# Patient Record
Sex: Male | Born: 1960
Health system: Southern US, Community
[De-identification: ages and names within clinical notes are randomized; demographics above are authoritative.]

## PROBLEM LIST (undated history)

## (undated) DIAGNOSIS — K219 Gastro-esophageal reflux disease without esophagitis: Secondary | ICD-10-CM

## (undated) DIAGNOSIS — J309 Allergic rhinitis, unspecified: Secondary | ICD-10-CM

## (undated) DIAGNOSIS — K635 Polyp of colon: Secondary | ICD-10-CM

## (undated) DIAGNOSIS — R9431 Abnormal electrocardiogram [ECG] [EKG]: Secondary | ICD-10-CM

## (undated) DIAGNOSIS — M1810 Unilateral primary osteoarthritis of first carpometacarpal joint, unspecified hand: Secondary | ICD-10-CM

## (undated) DIAGNOSIS — R0609 Other forms of dyspnea: Secondary | ICD-10-CM

## (undated) DIAGNOSIS — R0600 Dyspnea, unspecified: Secondary | ICD-10-CM

## (undated) HISTORY — DX: Polyp of colon: K63.5

## (undated) HISTORY — DX: Other forms of dyspnea: R06.09

## (undated) HISTORY — DX: Gastro-esophageal reflux disease without esophagitis: K21.9

## (undated) HISTORY — DX: Allergic rhinitis, unspecified: J30.9

## (undated) HISTORY — DX: Unilateral primary osteoarthritis of first carpometacarpal joint, unspecified hand: M18.10

## (undated) HISTORY — PX: COLONOSCOPY: SHX174

## (undated) HISTORY — DX: Dyspnea, unspecified: R06.00

## (undated) HISTORY — DX: Abnormal electrocardiogram (ECG) (EKG): R94.31

## (undated) HISTORY — PX: CHOLECYSTECTOMY: SHX55

---

## 2001-03-03 ENCOUNTER — Ambulatory Visit (HOSPITAL_COMMUNITY): Admission: RE | Admit: 2001-03-03 | Discharge: 2001-03-03 | Payer: Self-pay | Admitting: Gastroenterology

## 2001-04-11 ENCOUNTER — Ambulatory Visit (HOSPITAL_COMMUNITY): Admission: RE | Admit: 2001-04-11 | Discharge: 2001-04-11 | Payer: Self-pay | Admitting: Urology

## 2001-04-11 ENCOUNTER — Encounter: Payer: Self-pay | Admitting: Urology

## 2002-06-27 ENCOUNTER — Ambulatory Visit (HOSPITAL_COMMUNITY): Admission: RE | Admit: 2002-06-27 | Discharge: 2002-06-27 | Payer: Self-pay | Admitting: Family Medicine

## 2002-06-27 ENCOUNTER — Encounter: Payer: Self-pay | Admitting: Family Medicine

## 2002-10-22 ENCOUNTER — Encounter: Admission: RE | Admit: 2002-10-22 | Discharge: 2002-11-08 | Payer: Self-pay | Admitting: Unknown Physician Specialty

## 2003-01-28 ENCOUNTER — Encounter: Admission: RE | Admit: 2003-01-28 | Discharge: 2003-03-25 | Payer: Self-pay | Admitting: Specialist

## 2003-09-30 IMAGING — CT CT ABDOMEN WO/W CM
1 of 3 series · 13 of 32 positions shown, 19 images · IV contrast (CONTRAST)
Comparison: none

FINDINGS
CLINICAL DATA: LOW BACK PAIN AND PROSTATITIS.
CT ABDOMEN WITHOUT AND WITH CONTRAST
FOLLOWING ORAL CONTRAST ADMINISTRATION, INITIAL AXIAL IMAGES WERE OBTAINED THROUGH THE KIDNEYS
WITHOUT INTRAVENOUS CONTRAST.  THESE WERE FOLLOWED BY AXIAL IMAGES THROUGH THE ABDOMEN AND PELVIS
WITH 100 CC OMNIPAQUE 300 INTRAVENOUS CONTRAST.  THE KIDNEYS HAVE NORMAL APPEARANCES.
SPECIFICALLY, NO RENAL CALCULI ARE SEEN AND NO HYDRONEPHROSIS IS DEMONSTRATED.  MILDLY PROMINENT
EXTRARENAL PELVES ARE NOTED BILATERALLY, WITHOUT CALYCEAL DILATATION.  CHOLECYSTECTOMY CLIPS ARE
NOTED.  THE LIVER, SPLEEN, PANCREAS AND ADRENAL GLANDS HAVE NORMAL APPEARANCES.  NO ENLARGED LYMPH
NODES ARE SEEN.  MULTIPLE COLON DIVERTICULA ARE DEMONSTRATED WITHOUT EVIDENCE OF DIVERTICULITIS.
THE REGIONAL BONES ARE UNREMARKABLE.
IMPRESSION
1.  COLON  DIVERTICULOSIS WITHOUT EVIDENCE OF DIVERTICULITIS.
2.  STATUS POST CHOLECYSTECTOMY.
CT PELVIS WITH CONTRAST
THE PROSTATE GLAND URINARY BLADDER HAVE NORMAL APPEARANCES.  MULTIPLE SIGMOID COLON DIVERTICULA ARE
DEMONSTRATED WITHOUT EVIDENCE OF DIVERTICULITIS.  NO ENLARGED LYMPH NODES ARE SEEN.  THE PELVIC
BONES ARE UNREMARKABLE.
1.  SIGMOID DIVERTICULOSIS.
2.  NO ACUTE ABNORMALITY.

[Series 1841: — · axial · 0.69mm/px · z∈[+1372,+1762]mm · 13 of 90 slices shown, 19 images]
[im 6/90  soft-tissue]
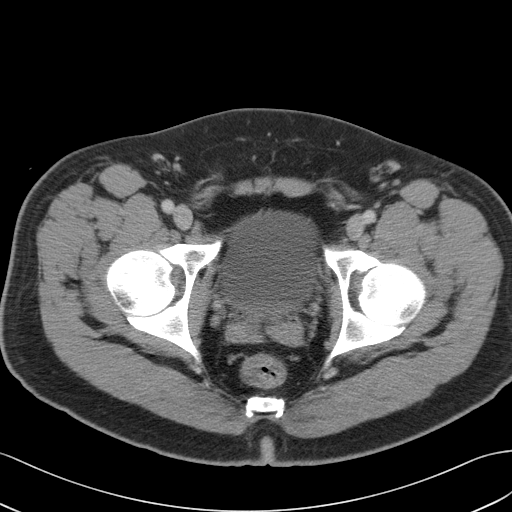
[im 6/90  bone]
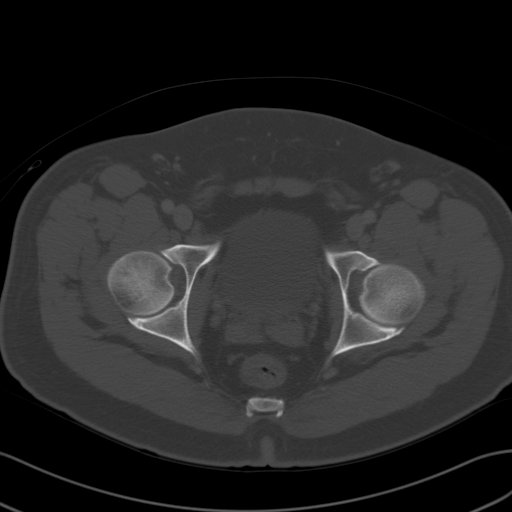
[im 12/90  soft-tissue]
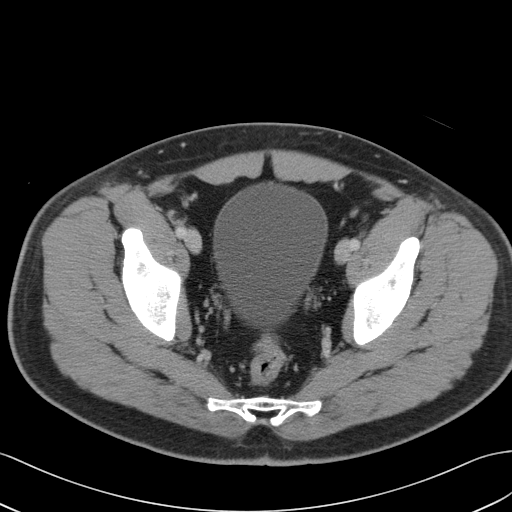
[im 18/90  soft-tissue]
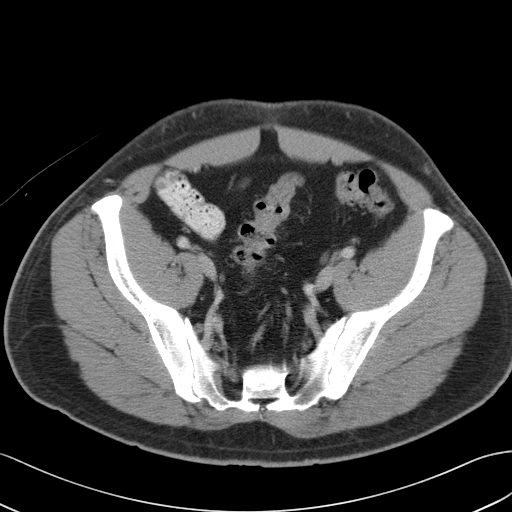
[im 24/90  soft-tissue]
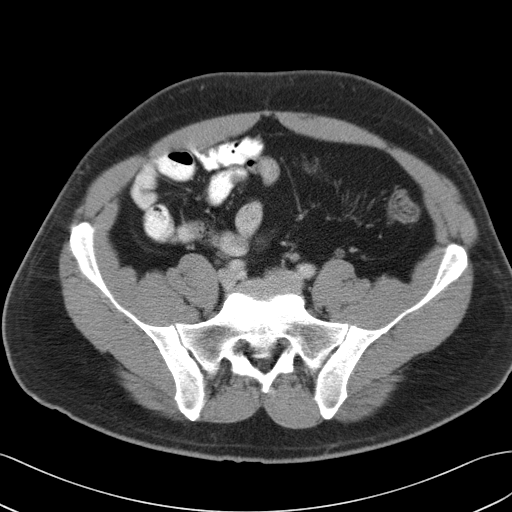
[im 30/90  soft-tissue]
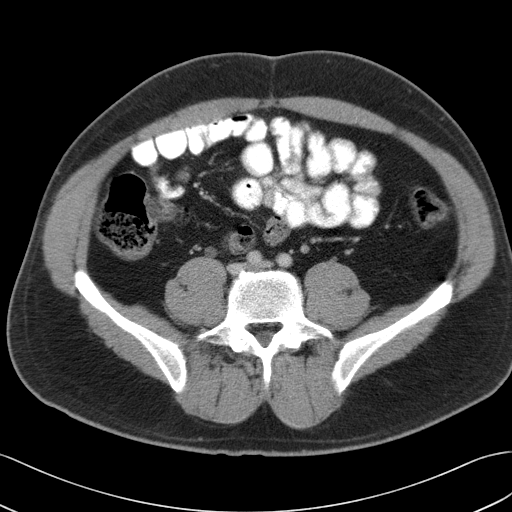
[im 36/90  soft-tissue]
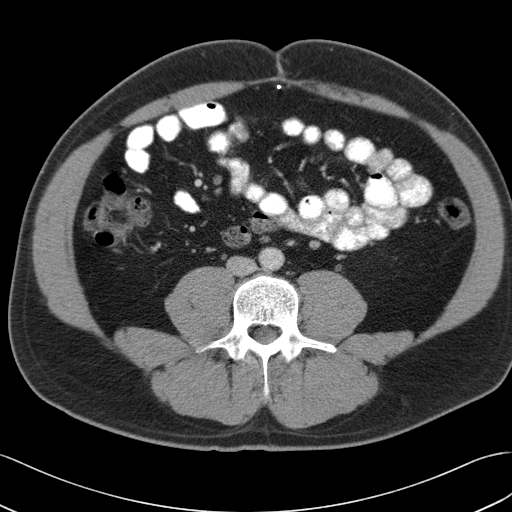
[im 48/90  soft-tissue]
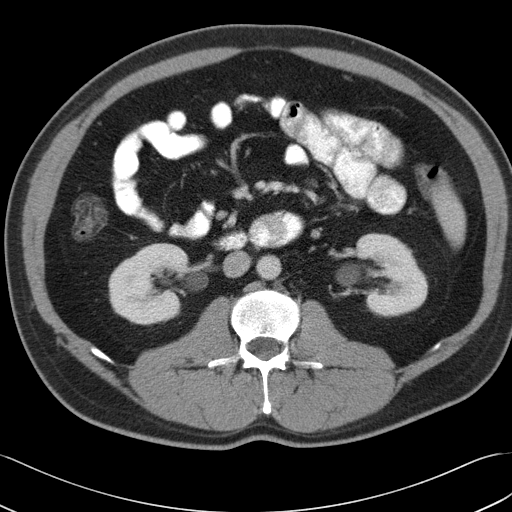
[im 54/90  soft-tissue]
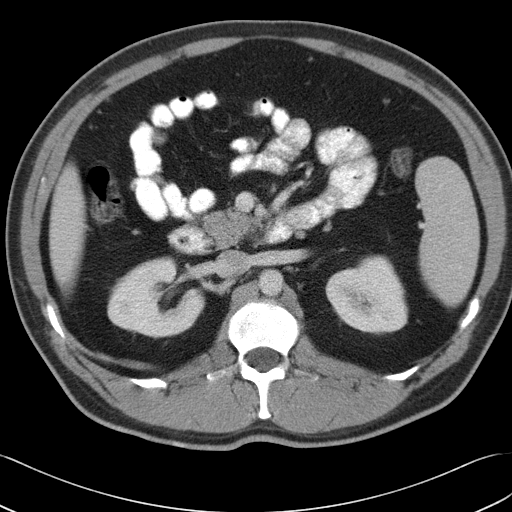
[im 60/90  soft-tissue]
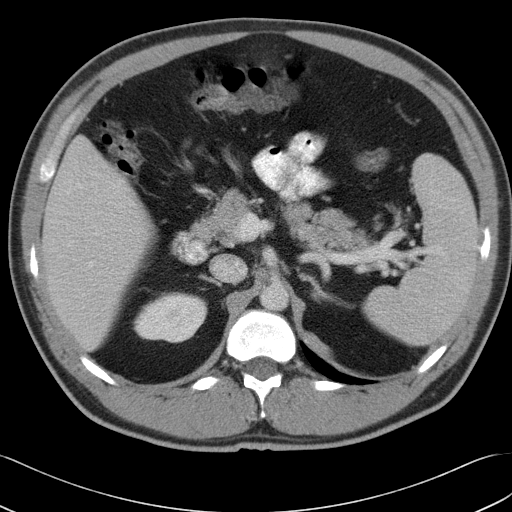
[im 60/90  bone]
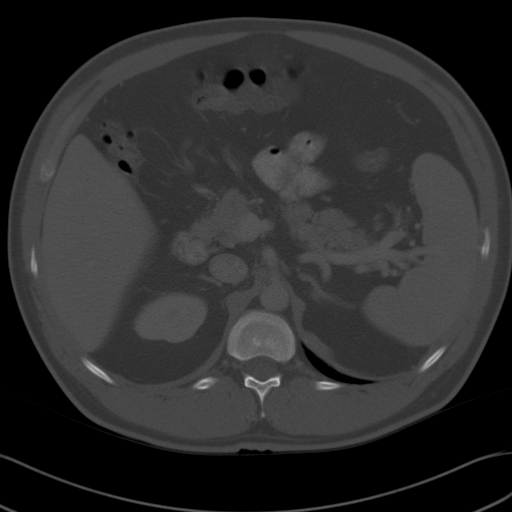
[im 66/90  soft-tissue]
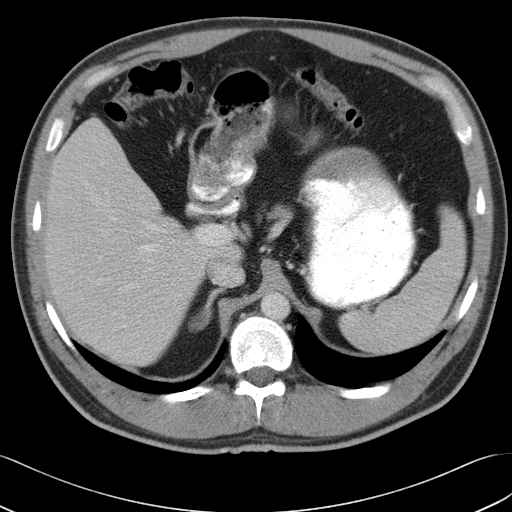
[im 66/90  lung]
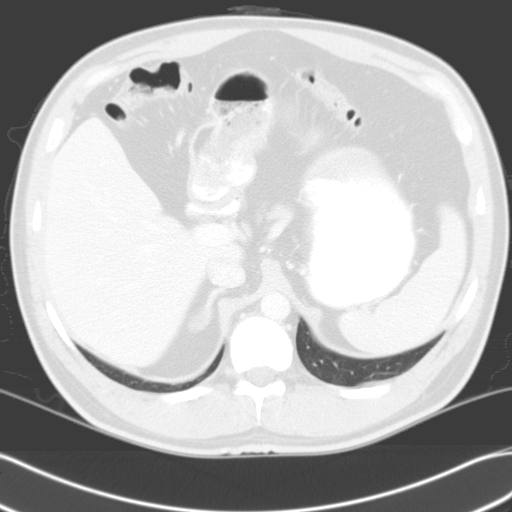
[im 72/90  soft-tissue]
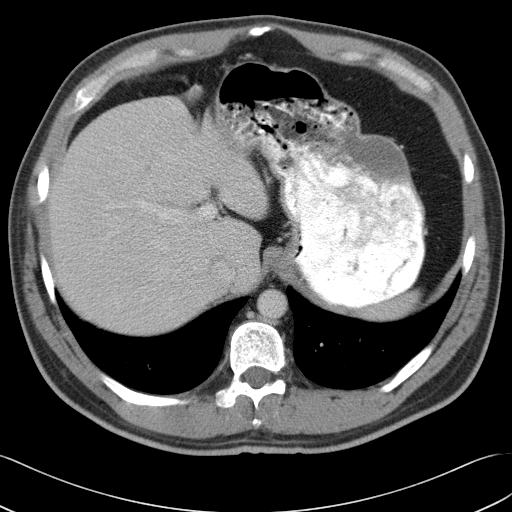
[im 72/90  lung]
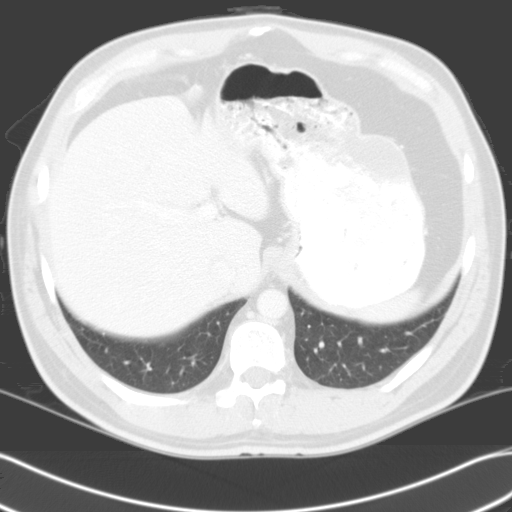
[im 78/90  soft-tissue]
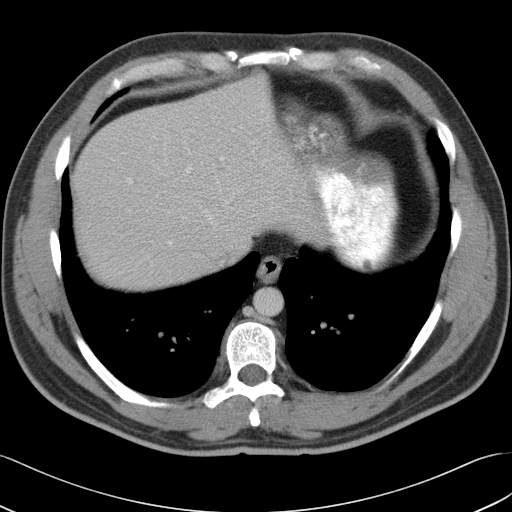
[im 78/90  lung]
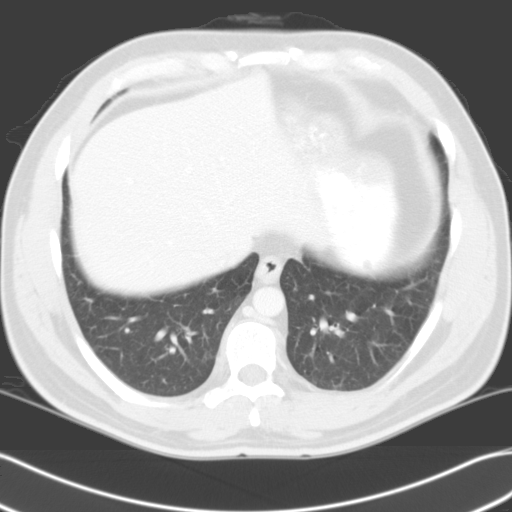
[im 84/90  soft-tissue]
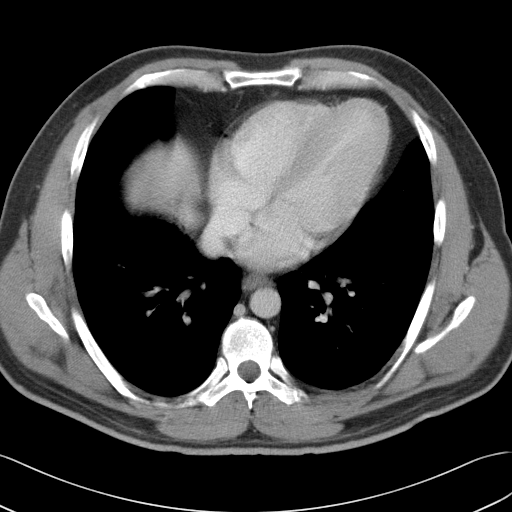
[im 84/90  lung]
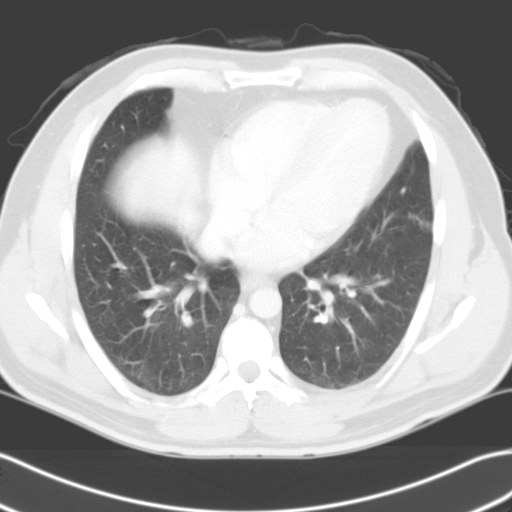

[13 of 32 positions shown; findings below may reference images not displayed]

## 2011-04-05 ENCOUNTER — Encounter (HOSPITAL_COMMUNITY): Payer: Self-pay | Admitting: *Deleted

## 2011-04-05 ENCOUNTER — Emergency Department (HOSPITAL_COMMUNITY)
Admission: EM | Admit: 2011-04-05 | Discharge: 2011-04-05 | Disposition: A | Discharge status: Home / Self Care | Payer: Managed Care, Other (non HMO) | Attending: Emergency Medicine | Admitting: Emergency Medicine

## 2011-04-05 DIAGNOSIS — M545 Low back pain, unspecified: Secondary | ICD-10-CM | POA: Insufficient documentation

## 2011-04-05 DIAGNOSIS — S39012A Strain of muscle, fascia and tendon of lower back, initial encounter: Secondary | ICD-10-CM

## 2011-04-05 LAB — POCT I-STAT, CHEM 8
BUN: 9 mg/dL (ref 6–23)
Calcium, Ion: 1.19 mmol/L (ref 1.12–1.32)
Chloride: 106 mEq/L (ref 96–112)
Creatinine, Ser: 1 mg/dL (ref 0.50–1.35)
Glucose, Bld: 113 mg/dL — ABNORMAL HIGH (ref 70–99)
HCT: 48 % (ref 39.0–52.0)
Hemoglobin: 16.3 g/dL (ref 13.0–17.0)
Potassium: 4.3 mEq/L (ref 3.5–5.1)
Sodium: 141 mEq/L (ref 135–145)
TCO2: 24 mmol/L (ref 0–100)

## 2011-04-05 LAB — URINALYSIS, ROUTINE W REFLEX MICROSCOPIC
Bilirubin Urine: NEGATIVE
Glucose, UA: NEGATIVE mg/dL
Hgb urine dipstick: NEGATIVE
Ketones, ur: NEGATIVE mg/dL
Leukocytes, UA: NEGATIVE
Nitrite: NEGATIVE
Protein, ur: NEGATIVE mg/dL
Specific Gravity, Urine: 1.02 (ref 1.005–1.030)
Urobilinogen, UA: 0.2 mg/dL (ref 0.0–1.0)
pH: 5.5 (ref 5.0–8.0)

## 2011-04-05 MED ORDER — CYCLOBENZAPRINE HCL 10 MG PO TABS: 10.0000 mg | ORAL_TABLET | Freq: Two times a day (BID) | ORAL | Status: AC | PRN

## 2011-04-05 MED ORDER — OXYCODONE-ACETAMINOPHEN 5-325 MG PO TABS: 2.0000 | ORAL_TABLET | ORAL | Status: AC | PRN

## 2011-04-05 MED ORDER — KETOROLAC TROMETHAMINE 60 MG/2ML IM SOLN
60.0000 mg | Freq: Once | INTRAMUSCULAR | Status: AC
  Administered 2011-04-05: 60 mg via INTRAMUSCULAR
  Filled 2011-04-05: qty 2

## 2011-04-05 NOTE — ED Notes (Signed)
Pt presents to er with right flank pain associated with nausea, pt states that the pain started yesterday morning, comes and goes, denies any injury, pt also states that if he turns to the right the pain is increased and pain is increased with bending over.

## 2011-04-05 NOTE — Discharge Instructions (Signed)
Back Exercises   Back exercises help treat and prevent back injuries. The goal of back exercises is to increase the strength of your abdominal and back muscles and the flexibility of your back. These exercises should be started when you no longer have back pain. Back exercises include:   Pelvic Tilt. Lie on your back with your knees bent. Tilt your pelvis until the lower part of your back is against the floor. Hold this position 5 to 10 sec and repeat 5 to 10 times.   Knee to Chest. Pull first 1 knee up against your chest and hold for 20 to 30 seconds, repeat this with the other knee, and then both knees. This may be done with the other leg straight or bent, whichever feels better.   Sit-Ups or Curl-Ups. Bend your knees 90 degrees. Start with tilting your pelvis, and do a partial, slow sit-up, lifting your trunk only 30 to 45 degrees off the floor. Take at least 2 to 3 seconds for each sit-up. Do not do sit-ups with your knees out straight. If partial sit-ups are difficult, simply do the above but with only tightening your abdominal muscles and holding it as directed.   Hip-Lift. Lie on your back with your knees flexed 90 degrees. Push down with your feet and shoulders as you raise your hips a couple inches off the floor; hold for 10 seconds, repeat 5 to 10 times.   Back arches. Lie on your stomach, propping yourself up on bent elbows. Slowly press on your hands, causing an arch in your low back. Repeat 3 to 5 times. Any initial stiffness and discomfort should lessen with repetition over time.   Shoulder-Lifts. Lie face down with arms beside your body. Keep hips and torso pressed to floor as you slowly lift your head and shoulders off the floor.   Do not overdo your exercises, especially in the beginning. Exercises may cause you some mild back discomfort which lasts for a few minutes; however, if the pain is more severe, or lasts for more than 15 minutes, do not continue exercises until you see your caregiver.  Improvement with exercise therapy for back problems is slow.   See your caregivers for assistance with developing a proper back exercise program.   Document Released: 01/29/2004 Document Revised: 12/10/2010 Document Reviewed: 12/21/2004   ExitCare® Patient Information ©2012 [employer].     Back Pain, Adult   Low back pain is very common. About 1 in 5 people have back pain. The cause of low back pain is rarely dangerous. The pain often gets better over time. About half of people with a sudden onset of back pain feel better in just 2 weeks. About 8 in 10 people feel better by 6 weeks.   CAUSES   Some common causes of back pain include:   Strain of the muscles or ligaments supporting the spine.   Wear and tear (degeneration) of the spinal discs.   Arthritis.   Direct injury to the back.   DIAGNOSIS   Most of the time, the direct cause of low back pain is not known. However, back pain can be treated effectively even when the exact cause of the pain is unknown. Answering your caregiver's questions about your overall health and symptoms is one of the most accurate ways to make sure the cause of your pain is not dangerous. If your caregiver needs more information, he or she may order lab work or imaging tests (X-rays or MRIs). However, even   if imaging tests show changes in your back, this usually does not require surgery.   HOME CARE INSTRUCTIONS   For many people, back pain returns. Since low back pain is rarely dangerous, it is often a condition that people can learn to manage on their own.   Remain active. It is stressful on the back to sit or stand in one place. Do not sit, drive, or stand in one place for more than 30 minutes at a time. Take short walks on level surfaces as soon as pain allows. Try to increase the length of time you walk each day.   Do not stay in bed. Resting more than 1 or 2 days can delay your recovery.   Do not avoid exercise or work. Your body is made to move. It is not dangerous to be active,  even though your back may hurt. Your back will likely heal faster if you return to being active before your pain is gone.   Pay attention to your body when you bend and lift. Many people have less discomfort when lifting if they bend their knees, keep the load close to their bodies, and avoid twisting. Often, the most comfortable positions are those that put less stress on your recovering back.   Find a comfortable position to sleep. Use a firm mattress and lie on your side with your knees slightly bent. If you lie on your back, put a pillow under your knees.   Only take over-the-counter or prescription medicines as directed by your caregiver. Over-the-counter medicines to reduce pain and inflammation are often the most helpful. Your caregiver may prescribe muscle relaxant drugs. These medicines help dull your pain so you can more quickly return to your normal activities and healthy exercise.   Put ice on the injured area.   Put ice in a plastic bag.   Place a towel between your skin and the bag.   Leave the ice on for 15 to 20 minutes, 3 to 4 times a day for the first 2 to 3 days. After that, ice and heat may be alternated to reduce pain and spasms.   Ask your caregiver about trying back exercises and gentle massage. This may be of some benefit.   Avoid feeling anxious or stressed. Stress increases muscle tension and can worsen back pain. It is important to recognize when you are anxious or stressed and learn ways to manage it. Exercise is a great option.   SEEK MEDICAL CARE IF:   You have pain that is not relieved with rest or medicine.   You have pain that does not improve in 1 week.   You have new symptoms.   You are generally not feeling well.   SEEK IMMEDIATE MEDICAL CARE IF:   You have pain that radiates from your back into your legs.   You develop new bowel or bladder control problems.   You have unusual weakness or numbness in your arms or legs.   You develop nausea or vomiting.   You develop abdominal  pain.   You feel faint.   Document Released: 12/21/2004 Document Revised: 12/10/2010 Document Reviewed: 05/11/2010   ExitCare® Patient Information ©2012 [employer].

## 2011-04-05 NOTE — ED Provider Notes (Signed)
History   This chart was scribed for Fernando Quarry, MD by Fernando Nguyen. The patient was seen in room APA10/APA10. Patient's care was started at 0653.   CSN: [id number]  Arrival date & time 04/05/11  5621   First MD Initiated Contact with Patient 04/05/11 0703      Chief Complaint  Patient presents with  . Flank Pain    (Consider location/radiation/quality/duration/timing/severity/associated sxs/prior treatment) HPI Fernando Nguyen is a 51 y.o. male who presents to the Emergency Department complaining of 6/10 right flank pain onset yesterday morning and intermittent since with associated nausea. Patient describes pain as a dull ache which sometimes radiates to the abdomen and groin but denies radiation to testicles. Pain is aggravated into a sharp pain with 10/10 severity, by turing to the right and bending over. Denies injury to right flank, and dysuria. Patient h/o cholecystectomy. Family history of kidney stones.   History reviewed. No pertinent past medical history.  Past Surgical History  Procedure Date  . Cholecystectomy     No family history on file.  History  Substance Use Topics  . Smoking status: Never Smoker   . Smokeless tobacco: Not on file  . Alcohol Use: No      Review of Systems A complete 10 system review of systems was obtained and all systems are negative except as noted in the HPI and PMH.   Allergies  Penicillins  Home Medications  No current outpatient prescriptions on file.  BP 158/99  Pulse 85  Temp 98.6 F (37 C)  Resp 18  Ht 5' 5.5" (1.664 m)  Wt 202 lb (91.627 kg)  BMI 33.10 kg/m2  SpO2 96%  Physical Exam  Nursing note and vitals reviewed. Constitutional: He is oriented to person, place, and time. He appears well-developed and well-nourished. No distress.  HENT:  Head: Normocephalic and atraumatic.  Mouth/Throat: Oropharynx is clear and moist.  Eyes: EOM are normal. Pupils are equal, round, and reactive to light.  Neck: Neck  supple. No tracheal deviation present.  Cardiovascular: Normal rate, regular rhythm and normal heart sounds.   Pulmonary/Chest: Effort normal and breath sounds normal. No respiratory distress. He has no wheezes.  Abdominal: Soft. He exhibits no distension. There is no tenderness.  Musculoskeletal: Normal range of motion. He exhibits tenderness (Right flank). He exhibits no edema.  Neurological: He is alert and oriented to person, place, and time. No sensory deficit.  Skin: Skin is warm and dry.  Psychiatric: He has a normal mood and affect. His behavior is normal.    ED Course  Procedures (including critical care time) DIAGNOSTIC STUDIES: Oxygen Saturation is 96% on room air, normal by my interpretation.    COORDINATION OF CARE: 7:20 AM- Patient informed of current plan for treatment and evaluation and agrees with plan at this time.      Labs Reviewed  URINALYSIS, ROUTINE W REFLEX MICROSCOPIC   No results found.   No diagnosis found.    MDM  Patient with tender right lower muscular pain in the back. He does not have any blood in his urine. His creatinine is normal. I am treating this is musculoskeletal pain. Patient is instructed that will take anti-inflammatories and pain medicine. She will followup with primary care care Dr.    I personally performed the services described in this documentation, which was scribed in my presence. The recorded information has been reviewed and considered.    Fernando Quarry, MD 04/05/11 [phone]

## 2012-04-17 ENCOUNTER — Encounter: Payer: Self-pay | Admitting: Gastroenterology

## 2012-05-12 ENCOUNTER — Encounter: Payer: Self-pay | Admitting: Gastroenterology

## 2012-05-12 ENCOUNTER — Telehealth: Payer: Self-pay | Admitting: Genetic Counselor

## 2012-05-12 ENCOUNTER — Ambulatory Visit (INDEPENDENT_AMBULATORY_CARE_PROVIDER_SITE_OTHER): Payer: BC Managed Care – PPO | Admitting: Gastroenterology

## 2012-05-12 ENCOUNTER — Telehealth: Payer: Self-pay | Admitting: Gastroenterology

## 2012-05-12 VITALS — BP 140/72 | HR 88 | Ht 65.0 in | Wt 202.5 lb

## 2012-05-12 DIAGNOSIS — R195 Other fecal abnormalities: Secondary | ICD-10-CM

## 2012-05-12 DIAGNOSIS — Z800 Family history of malignant neoplasm of digestive organs: Secondary | ICD-10-CM

## 2012-05-12 MED ORDER — PEG-KCL-NACL-NASULF-NA ASC-C 100 G PO SOLR: 1.0000 | Freq: Once | ORAL | Status: DC

## 2012-05-12 NOTE — Patient Instructions (Addendum)
You will be set up for a colonoscopy (LEC, moderate sedation) for strong FH of colon cancer, FOBT+. We will refer to genetic councilor to discuss your FH of colon cancer (brother and father). We will contact you with an appt date and time for your genetic counselor.   You have been scheduled for a colonoscopy. Please follow written instructions given to you at your visit today.  Please pick up your prep kit at the pharmacy within the next 1-3 days. If you use inhalers (even only as needed), please bring them with you on the day of your procedure. Your physician has requested that you go to www.startemmi.com and enter the access code given to you at your visit today. This web site gives a general overview about your procedure. However, you should still follow specific instructions given to you by our office regarding your preparation for the procedure.

## 2012-05-12 NOTE — Telephone Encounter (Signed)
S/W PT IN RE GENETIC APPT 6/09 @ 11 W/KAREN POWELL.  REFERRING DR. DAN JACOBS WELCOME PACKET MAILED.

## 2012-05-12 NOTE — Telephone Encounter (Signed)
Will forward to Dr. Christella Hartigan to let him know patient cancelled appt with genetic counselor.

## 2012-05-12 NOTE — Telephone Encounter (Signed)
Ok, thanks.

## 2012-05-12 NOTE — Progress Notes (Signed)
HPI: This is a    very pleasant 52 year old man whom I am seeing today for the first time.  He is with his wife today.  Dr. Wylene Simmer is his PCP, he had Hemoccult testing as part of her routine physical and it was positive.   His father had CRC, diagnosed in his 85's, died in his early 10s.    His older brother also died of colon cancer at age 22.  He had colonoscopy 2001, he believes he had 2-3 polyps,  Maybe done by [facility]  Review of systems: Pertinent positive and negative review of systems were noted in the above HPI section. Complete review of systems was performed and was otherwise normal.    Past Medical History  Diagnosis Date  . Colon polyps     Past Surgical History  Procedure Laterality Date  . Cholecystectomy      No current outpatient prescriptions on file.   No current facility-administered medications for this visit.    Allergies as of 05/12/2012 - Review Complete 05/12/2012  Allergen Reaction Noted  . Penicillins  04/05/2011    Family History  Problem Relation Age of Onset  . Colon cancer Father     deceased  . Colon cancer Brother 12    deceased age 68  . Colon polyps Father   . Ulcers Father     gastric  . Diabetes Mother   . Kidney disease Mother   . Heart disease Mother   . Diabetes Paternal Grandmother   . Heart disease Paternal Grandmother   . Ovarian cancer Maternal Grandmother     History   Social History  . Marital Status: Married    Spouse Name: N/A    Number of Children: 2  . Years of Education: N/A   Occupational History  . Licensed conveyancer    Social History Main Topics  . Smoking status: Never Smoker   . Smokeless tobacco: Never Used  . Alcohol Use: No  . Drug Use: No  . Sexually Active: Not on file   Other Topics Concern  . Not on file   Social History Narrative  . No narrative on file       Physical Exam: Ht 5\' 5"  (1.651 m)  Wt 202 lb 8 oz (91.853 kg)  BMI 33.7 kg/m2 Constitutional: generally  well-appearing Psychiatric: alert and oriented x3 Eyes: extraocular movements intact Mouth: oral pharynx moist, no lesions Neck: supple no lymphadenopathy Cardiovascular: heart regular rate and rhythm Lungs: clear to auscultation bilaterally Abdomen: soft, nontender, nondistended, no obvious ascites, no peritoneal signs, normal bowel sounds Extremities: no lower extremity edema bilaterally Skin: no lesions on visible extremities    Assessment and plan: 52 y.o. male with  2 first-degree relatives with colon cancer, Hemoccult-positive stool  By recommended we go ahead with colonoscopy at his soonest convenience. I explained to him that he did not need any further Hemoccult testing for colon cancer screening. Instead he should be getting colonoscopy at least every 5 years given his significant family history. I am going to refer him to genetic counseling as well, he has 2 first-degree relatives with colon cancer and should be considered for evaluation for Lynch syndrome. That would increase the frequency of his colon cancer screening significantly.

## 2012-05-17 ENCOUNTER — Ambulatory Visit (AMBULATORY_SURGERY_CENTER): Payer: BC Managed Care – PPO | Admitting: Gastroenterology

## 2012-05-17 ENCOUNTER — Encounter: Payer: Self-pay | Admitting: Gastroenterology

## 2012-05-17 VITALS — BP 118/73 | HR 81 | Temp 97.6°F | Resp 19 | Ht 65.0 in | Wt 202.0 lb

## 2012-05-17 DIAGNOSIS — R195 Other fecal abnormalities: Secondary | ICD-10-CM

## 2012-05-17 DIAGNOSIS — D126 Benign neoplasm of colon, unspecified: Secondary | ICD-10-CM

## 2012-05-17 DIAGNOSIS — Z800 Family history of malignant neoplasm of digestive organs: Secondary | ICD-10-CM

## 2012-05-17 DIAGNOSIS — K5730 Diverticulosis of large intestine without perforation or abscess without bleeding: Secondary | ICD-10-CM

## 2012-05-17 MED ORDER — SODIUM CHLORIDE 0.9 % IV SOLN: 500.0000 mL | INTRAVENOUS | Status: DC

## 2012-05-17 NOTE — Patient Instructions (Addendum)
Impressions/recommendations:  Polyp (handout given) Diverticulosis (handout given) High Fiber Diet (handout given)  YOU HAD AN ENDOSCOPIC PROCEDURE TODAY AT THE [facility]: Refer to the procedure report that was given to you for any specific questions about what was found during the examination.  If the procedure report does not answer your questions, please call your gastroenterologist to clarify.  If you requested that your care partner not be given the details of your procedure findings, then the procedure report has been included in a sealed envelope for you to review at your convenience later.  YOU SHOULD EXPECT: Some feelings of bloating in the abdomen. Passage of more gas than usual.  Walking can help get rid of the air that was put into your GI tract during the procedure and reduce the bloating. If you had a lower endoscopy (such as a colonoscopy or flexible sigmoidoscopy) you may notice spotting of blood in your stool or on the toilet paper. If you underwent a bowel prep for your procedure, then you may not have a normal bowel movement for a few days.  DIET: Your first meal following the procedure should be a light meal and then it is ok to progress to your normal diet.  A half-sandwich or bowl of soup is an example of a good first meal.  Heavy or fried foods are harder to digest and may make you feel nauseous or bloated.  Likewise meals heavy in dairy and vegetables can cause extra gas to form and this can also increase the bloating.  Drink plenty of fluids but you should avoid alcoholic beverages for 24 hours.  ACTIVITY: Your care partner should take you home directly after the procedure.  You should plan to take it easy, moving slowly for the rest of the day.  You can resume normal activity the day after the procedure however you should NOT DRIVE or use heavy machinery for 24 hours (because of the sedation medicines used during the test).    SYMPTOMS TO REPORT IMMEDIATELY: A  gastroenterologist can be reached at any hour.  During normal business hours, 8:30 AM to 5:00 PM Monday through Friday, call [phone].  After hours and on weekends, please call the GI answering service at [phone] who will take a message and have the physician on call contact you.   Following lower endoscopy (colonoscopy or flexible sigmoidoscopy):  Excessive amounts of blood in the stool  Significant tenderness or worsening of abdominal pains  Swelling of the abdomen that is new, acute  Fever of 100F or higher  FOLLOW UP: If any biopsies were taken you will be contacted by phone or by letter within the next 1-3 weeks.  Call your gastroenterologist if you have not heard about the biopsies in 3 weeks.  Our staff will call the home number listed on your records the next business day following your procedure to check on you and address any questions or concerns that you may have at that time regarding the information given to you following your procedure. This is a courtesy call and so if there is no answer at the home number and we have not heard from you through the emergency physician on call, we will assume that you have returned to your regular daily activities without incident.  SIGNATURES/CONFIDENTIALITY: You and/or your care partner have signed paperwork which will be entered into your electronic medical record.  These signatures attest to the fact that that the information above on your After Visit Summary   has been reviewed and is understood.  Full responsibility of the confidentiality of this discharge information lies with you and/or your care-partner. 

## 2012-05-17 NOTE — Progress Notes (Signed)
Patient did not have preoperative order for IV antibiotic SSI prophylaxis. (G8918)  Patient did not experience any of the following events: a burn prior to discharge; a fall within the facility; wrong site/side/patient/procedure/implant event; or a hospital transfer or hospital admission upon discharge from the facility. (G8907)  

## 2012-05-17 NOTE — Op Note (Signed)
[facility] [address] [place], [address]   COLONOSCOPY PROCEDURE REPORT  PATIENT: Fernando Nguyen, Fernando Nguyen.  MR#: [id number] BIRTHDATE: 06-04-1960 , 52  yrs. old GENDER: Male ENDOSCOPIST: Rachael Fee, MD REFERRED GE:[id number] Tisovec, M.D. PROCEDURE DATE:  05/17/2012 PROCEDURE:   Colonoscopy with snare polypectomy ASA CLASS:   Class II INDICATIONS: fobt + stool.  His father had CRC, diagnosed in his 70's, died in his early 83s. His older brother also died of colon cancer at age 44. He had colonoscopy 2001, he believes he had 2-3 polyps, Maybe done by [facility]  MEDICATIONS: Fentanyl 50 mcg IV, Versed 7 mg IV, and These medications were titrated to patient response per physician's verbal order DESCRIPTION OF PROCEDURE:   After the risks benefits and alternatives of the procedure were thoroughly explained, informed consent was obtained.  A digital rectal exam revealed no abnormalities of the rectum.   The LB [id number]  endoscope was introduced through the anus and advanced to the cecum, which was identified by both the appendix and ileocecal valve. No adverse events experienced.   The quality of the prep was good.  The instrument was then slowly withdrawn as the colon was fully examined.   COLON FINDINGS: There were several diverticulum in left colon.  One small polyp was found, removed and sent to pathology.  This was in transverse segment, 3mm, sessile, required snare/cautery removal. The examination was otherwise normal.  Retroflexed views revealed no abnormalities. The time to cecum=2 minutes 03 seconds. Withdrawal time=7 minutes 58 seconds.  The scope was withdrawn and the procedure completed. COMPLICATIONS: There were no complications.  ENDOSCOPIC IMPRESSION: There were several diverticulum in left colon. One small polyp was found, removed and sent to pathology. The examination was otherwise normal.  RECOMMENDATIONS: 1.  Given your significant  family history of colon cancer, you should have a repeat colonoscopy in 2 years given your family history and the possibility you have Lynch Syndrome.  Please reconsider referral to genetic councilor to help with this issue as well. 2.  You will receive a letter within 1-2 weeks with the results of your biopsy as well as final recommendations.  Please call my office if you have not received a letter after 3 weeks.  eSigned:  Rachael Fee, MD 05/17/2012 8:57 AM

## 2012-05-18 ENCOUNTER — Telehealth: Payer: Self-pay | Admitting: *Deleted

## 2012-05-18 NOTE — Telephone Encounter (Signed)
  Follow up Call-  Call back number 05/17/2012  Post procedure Call Back phone  # [phone]  Permission to leave phone message Yes   [facility]

## 2012-05-25 ENCOUNTER — Encounter: Payer: Self-pay | Admitting: Gastroenterology

## 2012-06-12 ENCOUNTER — Other Ambulatory Visit: Payer: BC Managed Care – PPO | Admitting: Lab

## 2012-06-12 ENCOUNTER — Encounter: Payer: BC Managed Care – PPO | Admitting: Genetic Counselor

## 2014-05-24 ENCOUNTER — Encounter: Payer: Self-pay | Admitting: Gastroenterology

## 2015-05-21 ENCOUNTER — Encounter: Payer: Self-pay | Admitting: Gastroenterology

## 2015-05-28 ENCOUNTER — Telehealth: Payer: Self-pay | Admitting: Gastroenterology

## 2015-05-29 NOTE — Telephone Encounter (Signed)
Left message on machine to call back  

## 2015-05-30 NOTE — Telephone Encounter (Signed)
Left message on machine to call back  

## 2015-05-30 NOTE — Telephone Encounter (Signed)
Unable to reach pt will await further communication from the pt  

## 2015-06-25 ENCOUNTER — Encounter: Payer: Self-pay | Admitting: Gastroenterology

## 2015-08-01 ENCOUNTER — Ambulatory Visit (INDEPENDENT_AMBULATORY_CARE_PROVIDER_SITE_OTHER): Payer: BLUE CROSS/BLUE SHIELD | Admitting: Gastroenterology

## 2015-08-01 ENCOUNTER — Encounter: Payer: Self-pay | Admitting: Gastroenterology

## 2015-08-01 VITALS — BP 150/76 | HR 88 | Ht 65.0 in | Wt 206.2 lb

## 2015-08-01 DIAGNOSIS — Z800 Family history of malignant neoplasm of digestive organs: Secondary | ICD-10-CM | POA: Diagnosis not present

## 2015-08-01 MED ORDER — NA SULFATE-K SULFATE-MG SULF 17.5-3.13-1.6 GM/177ML PO SOLN: 1.0000 | Freq: Once | ORAL | 0 refills | Status: AC

## 2015-08-01 NOTE — Progress Notes (Signed)
Review of pertinent gastrointestinal problems: 1. FH of colon cancer, ? Lynch syndrome; father died on colon caner in early 63s, brother with colon cancer in 60s. He was recommended, offered referral to genetic councilor twice, declined. 2. Adenomatous colon polyps; colonoscopy Dr. Ardis Hughs 2014, 1 small TA, recommended recall at 2 years (see above).  HPI: This is a very pleasant 55 year old man who is here with his wife today.  Chief complaint is family history colon cancer, significant  His PCP check stool for occult blood as routine physical 2 months ago, it was positive.  No  changes in his bowels, no overt bleeding. His weight has been stable   Past Medical History:  Diagnosis Date  . Colon polyps     Past Surgical History:  Procedure Laterality Date  . CHOLECYSTECTOMY      Current Outpatient Prescriptions  Medication Sig Dispense Refill  . famotidine (PEPCID) 20 MG tablet Take 20 mg by mouth. One to two times a day     No current facility-administered medications for this visit.     Allergies as of 08/01/2015 - Review Complete 08/01/2015  Allergen Reaction Noted  . Penicillins  04/05/2011    Family History  Problem Relation Age of Onset  . Colon cancer Father     deceased  . Colon polyps Father   . Ulcers Father     gastric  . Colon cancer Brother 51    deceased age 63  . Diabetes Mother   . Kidney disease Mother   . Heart disease Mother   . Diabetes Paternal Grandmother   . Heart disease Paternal Grandmother   . Ovarian cancer Maternal Grandmother     Social History   Social History  . Marital status: Married    Spouse name: N/A  . Number of children: 2  . Years of education: N/A   Occupational History  . 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 Smoking status: Never Smoker  . Smokeless tobacco: Never Used  . Alcohol use No  . Drug use: No  . Sexual activity: Not on file   Other Topics Concern  . Not on file   Social  History Narrative  . No narrative on file     Physical Exam: BP (!) 150/76 (BP Location: Left Arm, Patient Position: Sitting, Cuff Size: Normal)   Pulse 88   Ht 5\' 5"  (1.651 m)   Wt 206 lb 4 oz (93.6 kg)   BMI 34.32 kg/m  Constitutional: generally well-appearing Psychiatric: alert and oriented x3 Abdomen: soft, nontender, nondistended, no obvious ascites, no peritoneal signs, normal bowel sounds   Assessment and plan: 55 y.o. male with significant family history of colon cancer  I again offered, recommended referral to genetic counselor but he declined. He has 2 family members who died young because of colon cancer. I'm going to presume he has Lynch syndrome for now. He understands that would mean a colonoscopy every 2 years at least. He does not need any other type of colon cancer screening between these examinations including Hemoccult testing. We will arrange for colonoscopy at his soonest convenience.   Owens Loffler, MD [facility] 08/01/2015, 11:02 AM

## 2015-08-02 ENCOUNTER — Emergency Department (HOSPITAL_COMMUNITY)
Admission: EM | Admit: 2015-08-02 | Discharge: 2015-08-02 | Disposition: A | Discharge status: Home / Self Care | Payer: BLUE CROSS/BLUE SHIELD | Attending: Emergency Medicine | Admitting: Emergency Medicine

## 2015-08-02 ENCOUNTER — Encounter (HOSPITAL_COMMUNITY): Payer: Self-pay | Admitting: Emergency Medicine

## 2015-08-02 ENCOUNTER — Emergency Department (HOSPITAL_COMMUNITY): Payer: BLUE CROSS/BLUE SHIELD

## 2015-08-02 DIAGNOSIS — Y939 Activity, unspecified: Secondary | ICD-10-CM | POA: Diagnosis not present

## 2015-08-02 DIAGNOSIS — W268XXA Contact with other sharp object(s), not elsewhere classified, initial encounter: Secondary | ICD-10-CM | POA: Diagnosis not present

## 2015-08-02 DIAGNOSIS — S6992XA Unspecified injury of left wrist, hand and finger(s), initial encounter: Secondary | ICD-10-CM | POA: Diagnosis present

## 2015-08-02 DIAGNOSIS — S61209A Unspecified open wound of unspecified finger without damage to nail, initial encounter: Secondary | ICD-10-CM

## 2015-08-02 DIAGNOSIS — Y929 Unspecified place or not applicable: Secondary | ICD-10-CM | POA: Diagnosis not present

## 2015-08-02 DIAGNOSIS — Y999 Unspecified external cause status: Secondary | ICD-10-CM | POA: Insufficient documentation

## 2015-08-02 DIAGNOSIS — Z79899 Other long term (current) drug therapy: Secondary | ICD-10-CM | POA: Diagnosis not present

## 2015-08-02 DIAGNOSIS — S61307A Unspecified open wound of left little finger with damage to nail, initial encounter: Secondary | ICD-10-CM | POA: Diagnosis not present

## 2015-08-02 MED ORDER — IBUPROFEN 800 MG PO TABS: 800.0000 mg | ORAL_TABLET | Freq: Three times a day (TID) | ORAL | 0 refills | Status: DC

## 2015-08-02 MED ORDER — OXYCODONE-ACETAMINOPHEN 5-325 MG PO TABS: 1.0000 | ORAL_TABLET | ORAL | 0 refills | Status: DC | PRN

## 2015-08-02 MED ORDER — CEPHALEXIN 500 MG PO CAPS: 500.0000 mg | ORAL_CAPSULE | Freq: Four times a day (QID) | ORAL | 0 refills | Status: DC

## 2015-08-02 MED ORDER — OXYCODONE-ACETAMINOPHEN 5-325 MG PO TABS
1.0000 | ORAL_TABLET | Freq: Once | ORAL | Status: AC
  Administered 2015-08-02: 1 via ORAL
  Filled 2015-08-02: qty 1

## 2015-08-02 NOTE — ED Triage Notes (Signed)
Patient c/o injury to left little finger injury. Patient states "Mashed finger tip off between two pieces of metal." Active bleeding noted, gauze dressing applied. Per patient tetanus vaccination between 5-10 years. Tip of left little finnger removed.

## 2015-08-02 NOTE — ED Notes (Signed)
Pt verbalized understanding of no driving and to use caution within 4 hours of taking pain meds due to meds cause drowsiness.  Instructed pt to take all of antibiotics as prescribed. 

## 2015-08-02 NOTE — Discharge Instructions (Signed)
Keep the dressing in place and you can remove it in 24 hrs.  Then re bandage and keep it splinted.  Call the hand specialist listed to arrange a follow-up appt.  Return to ER for any signs of infection

## 2015-08-02 NOTE — ED Provider Notes (Signed)
[facility] DEPT Provider Note   CSN: [id number] Arrival date & time: 08/02/15  1536  First Provider Contact:  First MD Initiated Contact with Patient 08/02/15 1559        History   Chief Complaint Chief Complaint  Patient presents with  . Finger Injury    HPI Fernando Nguyen is a 55 y.o. male.  HPI  Fernando Nguyen is a 55 y.o. male who presents to the Emergency Department complaining of laceration to the medial tip of his left fifth finger.  He describes a crush injury between two pieces of metal that resulted in a laceration.  Injury occurred at home at approximately 2:30 pm.  He has tried to apply pressure to control the bleeding.  He also reports mild numbness at the distal tip.  He denies blood thinners, swelling, injury to the nail or the remaining finger.  Last Td is 5-6 yrs ago.    Past Medical History:  Diagnosis Date  . Colon polyps     There are no active problems to display for this patient.   Past Surgical History:  Procedure Laterality Date  . CHOLECYSTECTOMY         Home Medications    Prior to Admission medications   Medication Sig Start Date End Date Taking? Authorizing Provider  famotidine (PEPCID) 20 MG tablet Take 20 mg by mouth. One to two times a day   Yes Historical Provider, MD    Family History Family History  Problem Relation Age of Onset  . Colon cancer Father     deceased  . Colon polyps Father   . Ulcers Father     gastric  . Colon cancer Brother 75    deceased age 81  . Diabetes Mother   . Kidney disease Mother   . Heart disease Mother   . Diabetes Paternal Grandmother   . Heart disease Paternal Grandmother   . Ovarian cancer Maternal Grandmother     Social History Social History  Substance Use Topics  . Smoking status: Never Smoker  . Smokeless tobacco: Never Used  . Alcohol use No     Allergies   Penicillins   Review of Systems Review of Systems  Constitutional: Negative for chills and fever.    Musculoskeletal: Negative for arthralgias, back pain and joint swelling.  Skin: Positive for wound.       Laceration finger  Neurological: Negative for dizziness, weakness and numbness (numbness of the distal left fifth finger).  Hematological: Does not bruise/bleed easily.  All other systems reviewed and are negative.    Physical Exam Updated Vital Signs BP 166/98 (BP Location: Left Arm)   Pulse 90   Temp 98.6 F (37 C) (Oral)   Resp 16   Ht 5\' 6"  (1.676 m)   Wt 93.4 kg   SpO2 100%   BMI 33.25 kg/m   Physical Exam  Constitutional: He is oriented to person, place, and time. He appears well-developed and well-nourished. No distress.  HENT:  Head: Normocephalic and atraumatic.  Cardiovascular: Normal rate, regular rhythm and intact distal pulses.   No murmur heard. Pulmonary/Chest: Effort normal and breath sounds normal. No respiratory distress.  Musculoskeletal: Normal range of motion. He exhibits no edema or tenderness (ttp of the distal left fifth finger.  complete skin avulsion of the medial aspect of the distal left fifth finger.  no bony involvement seen.  Nail intact.  Minimal Bleeding ).  Neurological: He is alert and oriented to person, place, and  time. He exhibits normal muscle tone. Coordination normal.  Skin: Skin is warm. Laceration noted.  Nursing note and vitals reviewed.    ED Treatments / Results  Labs (all labs ordered are listed, but only abnormal results are displayed) Labs Reviewed - No data to display  EKG  EKG Interpretation None       Radiology Dg Finger Little Left  Result Date: 08/02/2015 CLINICAL DATA:  Laceration. EXAM: LEFT LITTLE FINGER 2+V COMPARISON:  None. FINDINGS: Gauze over the distal fifth finger limits evaluation of fine cortical detail. Evaluation for subtle foreign bodies is also limited. Within these limitations, no fracture or foreign body identified. IMPRESSION: Limited study as noted above with no evidence of fracture or  foreign body. A repeat study after the dressing is removed could further evaluate if clinically warranted. Electronically Signed   By: Dorise Bullion III M.D   On: 08/02/2015 16:13   Procedures Procedures (including critical care time)  Medications Ordered in ED Medications - No data to display   Initial Impression / Assessment and Plan / ED Course  I have reviewed the triage vital signs and the nursing notes.  Pertinent labs & imaging results that were available during my care of the patient were reviewed by me and considered in my medical decision making (see chart for details).  Clinical Course    Pt also seen by Dr. Wilson Singer and care plan discussed.    Finger cleaned and soaked in saline and betadine solution.  No suturable wound. Dressing applied using Quick Clot, hemostasis obtained.     Finger splint applied.  Pt agrees to arrange close f/u with hand surgery.  Infection precautions discussed.    Final Clinical Impressions(s) / ED Diagnoses   Final diagnoses:  Avulsion of skin of finger, initial encounter    New Prescriptions Discharge Medication List as of 08/02/2015  5:10 PM    START taking these medications   Details  cephALEXin (KEFLEX) 500 MG capsule Take 1 capsule (500 mg total) by mouth 4 (four) times daily. For 7 days, Starting Sat 08/02/2015, Print    ibuprofen (ADVIL,MOTRIN) 800 MG tablet Take 1 tablet (800 mg total) by mouth 3 (three) times daily., Starting Sat 08/02/2015, Print    oxyCODONE-acetaminophen (PERCOCET/ROXICET) 5-325 MG tablet Take 1 tablet by mouth every 4 (four) hours as needed., Starting Sat 08/02/2015, Print         Sherrell Farish Rosalie, PA-C 08/02/15 1726    Virgel Manifold, MD 08/07/15 2128

## 2015-08-06 ENCOUNTER — Encounter: Payer: Self-pay | Admitting: Gastroenterology

## 2015-08-08 ENCOUNTER — Telehealth: Payer: Self-pay | Admitting: Gastroenterology

## 2015-08-08 NOTE — Telephone Encounter (Signed)
Magda Paganini can you help with the prep?  08/20/15 colon

## 2015-08-15 ENCOUNTER — Telehealth: Payer: Self-pay | Admitting: Gastroenterology

## 2015-08-15 MED ORDER — NA SULFATE-K SULFATE-MG SULF 17.5-3.13-1.6 GM/177ML PO SOLN: 1.0000 | Freq: Once | ORAL | 0 refills | Status: AC

## 2015-08-15 NOTE — Telephone Encounter (Signed)
The prescription was not available at [facility] drug, I sent the prescription to [facility] per pt request and also sent a voucher for no more than $50 to [facility].  The pt is aware and will call with any further concerns.

## 2015-08-20 ENCOUNTER — Ambulatory Visit (AMBULATORY_SURGERY_CENTER): Payer: BLUE CROSS/BLUE SHIELD | Admitting: Gastroenterology

## 2015-08-20 ENCOUNTER — Encounter: Payer: Self-pay | Admitting: Gastroenterology

## 2015-08-20 ENCOUNTER — Telehealth: Payer: Self-pay

## 2015-08-20 VITALS — BP 122/77 | HR 80 | Temp 98.0°F | Resp 22 | Ht 65.0 in | Wt 206.0 lb

## 2015-08-20 DIAGNOSIS — Z800 Family history of malignant neoplasm of digestive organs: Secondary | ICD-10-CM | POA: Diagnosis not present

## 2015-08-20 DIAGNOSIS — Z86010 Personal history of colonic polyps: Secondary | ICD-10-CM

## 2015-08-20 MED ORDER — SODIUM CHLORIDE 0.9 % IV SOLN: 500.0000 mL | INTRAVENOUS | Status: DC

## 2015-08-20 NOTE — Telephone Encounter (Signed)
The referral has been made, Genetics will call the pt with appt

## 2015-08-20 NOTE — Op Note (Signed)
[facility] Patient Name: Fernando Nguyen Procedure Date: 08/20/2015 10:33 AM MRN: [id number] Endoscopist: Milus Banister , MD Age: 55 Referring MD:  Date of Birth: Nov 03, 1960 Gender: Male Account #: [id number] Procedure:                Colonoscopy Indications:              High risk colon cancer surveillance: Personal                            history of colonic polyps (one subCM adenoma                            removed 2014); father and brother had colon cancer,                            one was in 80s Medicines:                Monitored Anesthesia Care Procedure:                Pre-Anesthesia Assessment:                           - Prior to the procedure, a History and Physical                            was performed, and patient medications and                            allergies were reviewed. The patient's tolerance of                            previous anesthesia was also reviewed. The risks                            and benefits of the procedure and the sedation                            options and risks were discussed with the patient.                            All questions were answered, and informed consent                            was obtained. Prior Anticoagulants: The patient has                            taken no previous anticoagulant or antiplatelet                            agents. ASA Grade Assessment: II - A patient with                            mild systemic disease. After reviewing the risks  and benefits, the patient was deemed in                            satisfactory condition to undergo the procedure.                           After obtaining informed consent, the colonoscope                            was passed under direct vision. Throughout the                            procedure, the patient's blood pressure, pulse, and                            oxygen saturations were monitored continuously. The                            Model CF-HQ190L [id number]) scope was introduced                            through the anus and advanced to the the cecum,                            identified by appendiceal orifice and ileocecal                            valve. The colonoscopy was performed without                            difficulty. The patient tolerated the procedure                            well. The quality of the bowel preparation was                            excellent. The ileocecal valve, appendiceal                            orifice, and rectum were photographed. Scope In: 10:36:39 AM Scope Out: 10:42:25 AM Scope Withdrawal Time: 0 hours 4 minutes 42 seconds  Total Procedure Duration: 0 hours 5 minutes 46 seconds  Findings:                 The entire examined colon appeared normal on direct                            and retroflexion views. Complications:            No immediate complications. Estimated blood loss:                            None. Estimated Blood Loss:     Estimated blood loss: none. Impression:               - The entire examined colon  is normal on direct and                            retroflexion views.                           - No polyps or cancers. Recommendation:           - Patient has a contact number available for                            emergencies. The signs and symptoms of potential                            delayed complications were discussed with the                            patient. Return to normal activities tomorrow.                            Written discharge instructions were provided to the                            patient.                           - Resume previous diet.                           - Continue present medications.                           - Repeat colonoscopy in 3 years for screening                            purposes (given strong FH of colon cancer)                           - Refer to a genetics  counselor to help determine                            if you have Lynch Syndrome in your family. Milus Banister, MD 08/20/2015 10:46:41 AM This report has been signed electronically.

## 2015-08-20 NOTE — Telephone Encounter (Signed)
Refer to a genetics counselor to help determine                            if you have Lynch Syndrome in your family.

## 2015-08-20 NOTE — Progress Notes (Signed)
To recovery, report to Tyrell, RN, VSS. 

## 2015-08-20 NOTE — Patient Instructions (Addendum)
DR. Ardis Hughs OFFICE WILL ARRANGE GENETIC COUNSELING .   TRY OMEPRAZOLE, OTC 20 MG 1 PILL ONCE DAILY 20-30 MINUTES MIN. BEFORE BREAKFAST.  YOU HAD AN ENDOSCOPIC PROCEDURE TODAY AT [facility]:   Refer to the procedure report that was given to you for any specific questions about what was found during the examination.  If the procedure report does not answer your questions, please call your gastroenterologist to clarify.  If you requested that your care partner not be given the details of your procedure findings, then the procedure report has been included in a sealed envelope for you to review at your convenience later.  YOU SHOULD EXPECT: Some feelings of bloating in the abdomen. Passage of more gas than usual.  Walking can help get rid of the air that was put into your GI tract during the procedure and reduce the bloating. If you had a lower endoscopy (such as a colonoscopy or flexible sigmoidoscopy) you may notice spotting of blood in your stool or on the toilet paper. If you underwent a bowel prep for your procedure, you may not have a normal bowel movement for a few days.  Please Note:  You might notice some irritation and congestion in your nose or some drainage.  This is from the oxygen used during your procedure.  There is no need for concern and it should clear up in a day or so.  SYMPTOMS TO REPORT IMMEDIATELY:   Following lower endoscopy (colonoscopy or flexible sigmoidoscopy):  Excessive amounts of blood in the stool  Significant tenderness or worsening of abdominal pains  Swelling of the abdomen that is new, acute  Fever of 100F or higher   For urgent or emergent issues, a gastroenterologist can be reached at any hour by calling [phone].   DIET:  We do recommend a small meal at first, but then you may proceed to your regular diet.  Drink plenty of fluids but you should avoid alcoholic beverages for 24 hours.  ACTIVITY:  You should plan to take it easy for  the rest of today and you should NOT DRIVE or use heavy machinery until tomorrow (because of the sedation medicines used during the test).    FOLLOW UP: Our staff will call the number listed on your records the next business day following your procedure to check on you and address any questions or concerns that you may have regarding the information given to you following your procedure. If we do not reach you, we will leave a message.  However, if you are feeling well and you are not experiencing any problems, there is no need to return our call.  We will assume that you have returned to your regular daily activities without incident.  If any biopsies were taken you will be contacted by phone or by letter within the next 1-3 weeks.  Please call us at [phone] if you have not heard about the biopsies in 3 weeks.    SIGNATURES/CONFIDENTIALITY: You and/or your care partner have signed paperwork which will be entered into your electronic medical record.  These signatures attest to the fact that that the information above on your After Visit Summary has been reviewed and is understood.  Full responsibility of the confidentiality of this discharge information lies with you and/or your care-partner.

## 2015-08-21 ENCOUNTER — Telehealth: Payer: Self-pay

## 2015-08-21 NOTE — Telephone Encounter (Signed)
Left message on machine.

## 2018-01-20 IMAGING — DX DG FINGER LITTLE 2+V*L*
3 series · 3 of 3 positions shown · non-contrast
Comparison: None.

CLINICAL DATA: Laceration.

EXAM:
LEFT LITTLE FINGER 2+V

[finger ap]
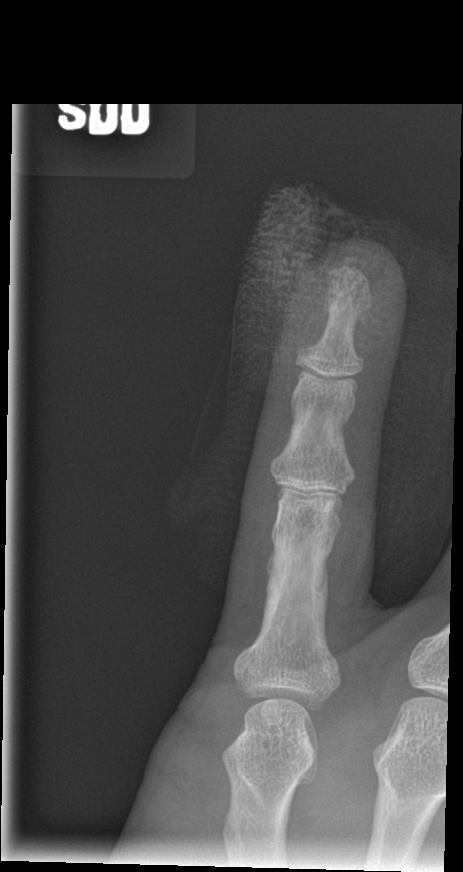

[finger obl]
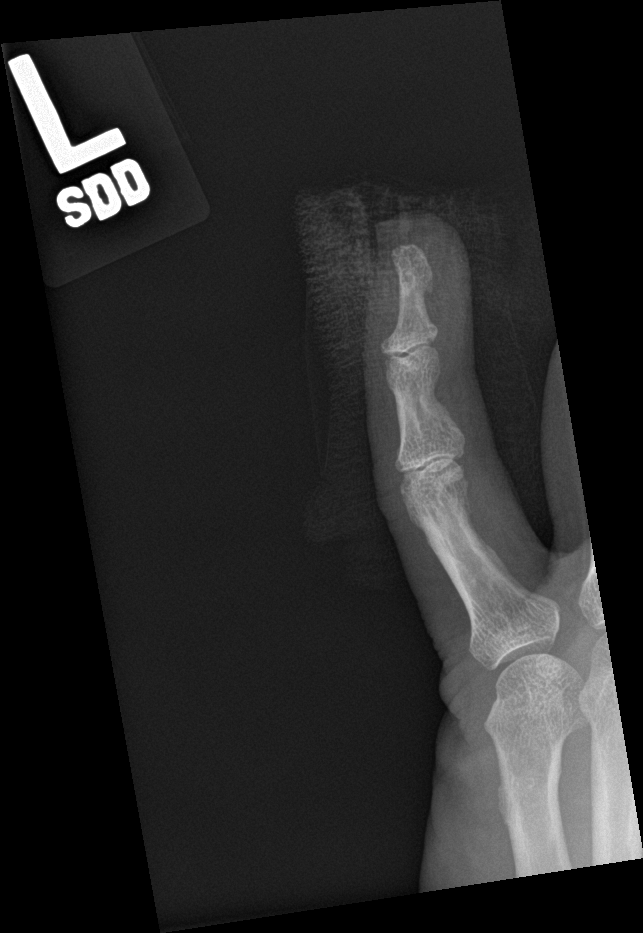

[finger lat]
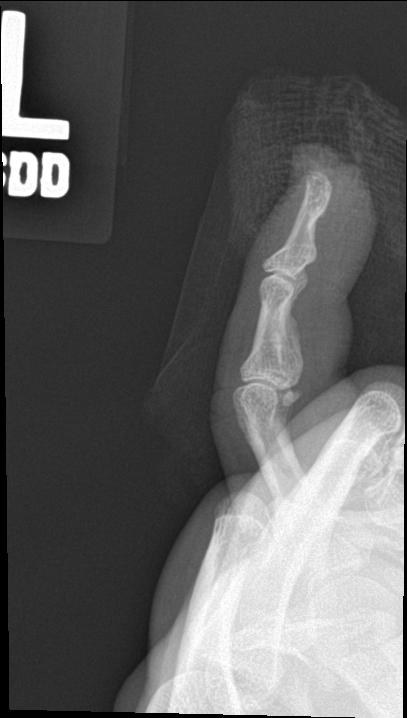

[3 of 3 positions shown; findings below may reference images not displayed]

FINDINGS: Gauze over the distal fifth finger limits evaluation of fine
cortical detail. Evaluation for subtle foreign bodies is also
limited. Within these limitations, no fracture or foreign body
identified.
IMPRESSION: Limited study as noted above with no evidence of fracture or foreign
body. A repeat study after the dressing is removed could further
evaluate if clinically warranted.

## 2018-07-31 ENCOUNTER — Encounter: Payer: Self-pay | Admitting: Gastroenterology

## 2018-11-15 ENCOUNTER — Encounter: Payer: Self-pay | Admitting: Gastroenterology

## 2018-11-15 ENCOUNTER — Ambulatory Visit (INDEPENDENT_AMBULATORY_CARE_PROVIDER_SITE_OTHER): Payer: Commercial Managed Care - PPO | Admitting: Gastroenterology

## 2018-11-15 ENCOUNTER — Other Ambulatory Visit: Payer: Self-pay

## 2018-11-15 VITALS — BP 168/88 | HR 87 | Temp 98.1°F | Ht 66.0 in | Wt 195.0 lb

## 2018-11-15 DIAGNOSIS — R1310 Dysphagia, unspecified: Secondary | ICD-10-CM | POA: Diagnosis not present

## 2018-11-15 DIAGNOSIS — Z800 Family history of malignant neoplasm of digestive organs: Secondary | ICD-10-CM

## 2018-11-15 DIAGNOSIS — Z1159 Encounter for screening for other viral diseases: Secondary | ICD-10-CM | POA: Diagnosis not present

## 2018-11-15 MED ORDER — FAMOTIDINE 20 MG PO TABS: 20.0000 mg | ORAL_TABLET | Freq: Every day | ORAL | 11 refills | Status: DC

## 2018-11-15 MED ORDER — OMEPRAZOLE 40 MG PO CPDR: 40.0000 mg | DELAYED_RELEASE_CAPSULE | Freq: Every day | ORAL | 11 refills | Status: DC

## 2018-11-15 MED ORDER — NA SULFATE-K SULFATE-MG SULF 17.5-3.13-1.6 GM/177ML PO SOLN: 1.0000 | Freq: Once | ORAL | 0 refills | Status: AC

## 2018-11-15 NOTE — Patient Instructions (Addendum)
If you are age 59 or older, your body mass index should be between 23-30. Your Body mass index is 31.47 kg/m. If this is out of the aforementioned range listed, please consider follow up with your Primary Care Provider.  If you are age 41 or younger, your body mass index should be between 19-25. Your Body mass index is 31.47 kg/m. If this is out of the aformentioned range listed, please consider follow up with your Primary Care Provider.   We have sent the following medications to your pharmacy for you to pick up at your convenience:  1. Start Omeprazole 40 mg take 1 capsule daily before breakfast.   2.Start Pepcid 20 mg nightly.   You have been scheduled for an endoscopy and colonoscopy. Please follow the written instructions given to you at your visit today. Please pick up your prep supplies at the pharmacy within the next 1-3 days. If you use inhalers (even only as needed), please bring them with you on the day of your procedure.

## 2018-11-15 NOTE — Progress Notes (Signed)
Review of pertinent gastrointestinal problems: 1.  Elevated risk for colon cancer; both his brother and father had colon cancer one while in their 56s.  Personal history of adenomatous polyps.  Colonoscopy 2014 a single subcentimeter adenoma was removed.  Repeat colonoscopy August 2017 showed a completely normal colon.  At that time I recommended a repeat colonoscopy at 3-year interval and also we referred him to a genetic counselor to help determine if he had Lynch syndrome in his family given his very strong family history.  He decided not to go ahead with that genetic counseling visit  HPI: This is a very pleasant 58 year old man whom I last saw a little over 3 years ago at the time of a colonoscopy above  He has had no troubles with his bowels since his last colonoscopy.  I discussed again with him genetic counselor visit, he declined again.  For the past 4 to 6 months he has had intermittent solid food slightly progressive dysphagia.  He has had to vomit food out at times.  He has had bloating as well.  He does have significant heartburn and pyrosis.  He takes Pepcid 20 mg in the morning every morning and gets relief for most of the morning but that has burning in his esophagus tend to come back in the afternoon at which time he might take an extra Pepcid.  He has lost 10 pounds in the past 2 years with intention.  No fevers or chills.  No significant abdominal pains.  No overt GI bleeding.   Review of systems: Pertinent positive and negative review of systems were noted in the above HPI section. All other review negative.   Past Medical History:  Diagnosis Date  . Colon polyps     Past Surgical History:  Procedure Laterality Date  . CHOLECYSTECTOMY      Current Outpatient Medications  Medication Sig Dispense Refill  . famotidine (PEPCID) 20 MG tablet Take 20 mg by mouth. One to two times a day     Current Facility-Administered Medications  Medication Dose Route Frequency  Provider Last Rate Last Dose  . 0.9 %  sodium chloride infusion  500 mL Intravenous Continuous Milus Banister, MD        Allergies as of 11/15/2018 - Review Complete 11/15/2018  Allergen Reaction Noted  . Penicillins  04/05/2011    Family History  Problem Relation Age of Onset  . Colon cancer Father        deceased  . Colon polyps Father   . Ulcers Father        gastric  . Colon cancer Brother 69       deceased age 2  . Diabetes Mother   . Kidney disease Mother   . Heart disease Mother   . Diabetes Paternal Grandmother   . Heart disease Paternal Grandmother   . Ovarian cancer Maternal Grandmother     Social History   Socioeconomic History  . Marital status: Married    Spouse name: Not on file  . Number of children: 2  . Years of education: Not on file  . Highest education level: Not on file  Occupational History  . Occupation: Manufacturing systems engineer: [employer]  . Financial resource strain: Not on file  . Food insecurity    Worry: Not on file    Inability: Not on file  . Transportation needs    Medical: Not on file    Non-medical: Not on  file  Tobacco Use  . Smoking status: Never Smoker  . Smokeless tobacco: Never Used  Substance and Sexual Activity  . Alcohol use: No  . Drug use: No  . Sexual activity: Not on file  Lifestyle  . Physical activity    Days per week: Not on file    Minutes per session: Not on file  . Stress: Not on file  Relationships  . Social Herbalist on phone: Not on file    Gets together: Not on file    Attends religious service: Not on file    Active member of club or organization: Not on file    Attends meetings of clubs or organizations: Not on file    Relationship status: Not on file  . Intimate partner violence    Fear of current or ex partner: Not on file    Emotionally abused: Not on file    Physically abused: Not on file    Forced sexual activity: Not on file  Other Topics Concern  .  Not on file  Social History Narrative  . Not on file     Physical Exam: BP (!) 168/88   Pulse 87   Temp 98.1 F (36.7 C)   Ht 5\' 6"  (1.676 m)   Wt 195 lb (88.5 kg)   BMI 31.47 kg/m  Constitutional: generally well-appearing Psychiatric: alert and oriented x3 Eyes: extraocular movements intact Mouth: oral pharynx moist, no lesions Neck: supple no lymphadenopathy Cardiovascular: heart regular rate and rhythm Lungs: clear to auscultation bilaterally Abdomen: soft, nontender, nondistended, no obvious ascites, no peritoneal signs, normal bowel sounds Extremities: no lower extremity edema bilaterally Skin: no lesions on visible extremities   Assessment and plan: 58 y.o. male with family history colon cancer, dysphagia  First he has a significant family history of colon cancer.  His brother was diagnosed with colon cancer in his 20s and his father was diagnosed with colon cancer in his 24s.  I again recommended that he consider genetic counselor visit.  He declined but explained that if he changes his mind he will certainly call.  For now he needs a repeat colonoscopy at his soonest convenience and I will probably advise him after that that he would need 1 every 1 to 2 years, I am going to presume that he probably has Lynch syndrome.  He has solid food slightly progressive dysphagia with significant typical GERD symptoms as well.  I recommend he change his famotidine so that he is taking it at bedtime instead of morning and I am giving him a prescription for omeprazole 40 mg pills to start taking shortly before breakfast every morning.  We will proceed with an EGD at the same time as his colonoscopy to check for neoplasm, significant strictures.  We will dilate if needed.  Please see the "Patient Instructions" section for addition details about the plan.   Owens Loffler, MD [facility] 11/15/2018, 3:39 PM  Cc: Haywood Pao, MD

## 2018-11-20 ENCOUNTER — Ambulatory Visit (INDEPENDENT_AMBULATORY_CARE_PROVIDER_SITE_OTHER): Payer: Commercial Managed Care - PPO

## 2018-11-20 DIAGNOSIS — Z1159 Encounter for screening for other viral diseases: Secondary | ICD-10-CM

## 2018-11-21 ENCOUNTER — Encounter: Payer: Self-pay | Admitting: Gastroenterology

## 2018-11-21 LAB — SARS CORONAVIRUS 2 (TAT 6-24 HRS): SARS Coronavirus 2: NEGATIVE

## 2018-11-22 ENCOUNTER — Ambulatory Visit (AMBULATORY_SURGERY_CENTER): Payer: Commercial Managed Care - PPO | Admitting: Gastroenterology

## 2018-11-22 ENCOUNTER — Other Ambulatory Visit: Payer: Self-pay

## 2018-11-22 ENCOUNTER — Encounter: Payer: Self-pay | Admitting: Gastroenterology

## 2018-11-22 VITALS — BP 125/88 | HR 73 | Temp 98.7°F | Resp 12 | Ht 66.0 in | Wt 195.0 lb

## 2018-11-22 DIAGNOSIS — Z800 Family history of malignant neoplasm of digestive organs: Secondary | ICD-10-CM

## 2018-11-22 DIAGNOSIS — Z86010 Personal history of colonic polyps: Secondary | ICD-10-CM | POA: Diagnosis not present

## 2018-11-22 DIAGNOSIS — K449 Diaphragmatic hernia without obstruction or gangrene: Secondary | ICD-10-CM

## 2018-11-22 DIAGNOSIS — D120 Benign neoplasm of cecum: Secondary | ICD-10-CM

## 2018-11-22 DIAGNOSIS — D123 Benign neoplasm of transverse colon: Secondary | ICD-10-CM

## 2018-11-22 DIAGNOSIS — D122 Benign neoplasm of ascending colon: Secondary | ICD-10-CM

## 2018-11-22 DIAGNOSIS — R1310 Dysphagia, unspecified: Secondary | ICD-10-CM

## 2018-11-22 DIAGNOSIS — K222 Esophageal obstruction: Secondary | ICD-10-CM

## 2018-11-22 MED ORDER — SODIUM CHLORIDE 0.9 % IV SOLN: 500.0000 mL | Freq: Once | INTRAVENOUS | Status: DC

## 2018-11-22 NOTE — Progress Notes (Signed)
A/ox3, pleased with MAC, report to RN 

## 2018-11-22 NOTE — Progress Notes (Signed)
Pt's states no medical or surgical changes since previsit or office visit.  Vitals- Courtney Covid- June 

## 2018-11-22 NOTE — Progress Notes (Signed)
Called to room to assist during endoscopic procedure.  Patient ID and intended procedure confirmed with present staff. Received instructions for my participation in the procedure from the performing physician.  

## 2018-11-22 NOTE — Patient Instructions (Addendum)
YOU HAD AN ENDOSCOPIC PROCEDURE TODAY AT [facility]:   Refer to the procedure report that was given to you for any specific questions about what was found during the examination.  If the procedure report does not answer your questions, please call your gastroenterologist to clarify.  If you requested that your care partner not be given the details of your procedure findings, then the procedure report has been included in a sealed envelope for you to review at your convenience later.  YOU SHOULD EXPECT: Some feelings of bloating in the abdomen. Passage of more gas than usual.  Walking can help get rid of the air that was put into your GI tract during the procedure and reduce the bloating. If you had a lower endoscopy (such as a colonoscopy or flexible sigmoidoscopy) you may notice spotting of blood in your stool or on the toilet paper. If you underwent a bowel prep for your procedure, you may not have a normal bowel movement for a few days.  Please Note:  You might notice some irritation and congestion in your nose or some drainage.  This is from the oxygen used during your procedure.  There is no need for concern and it should clear up in a day or so.  SYMPTOMS TO REPORT IMMEDIATELY:   Following lower endoscopy (colonoscopy or flexible sigmoidoscopy):  Excessive amounts of blood in the stool  Significant tenderness or worsening of abdominal pains  Swelling of the abdomen that is new, acute  Fever of 100F or higher   Following upper endoscopy (EGD)  Vomiting of blood or coffee ground material  New chest pain or pain under the shoulder blades  Painful or persistently difficult swallowing  New shortness of breath  Fever of 100F or higher  Black, tarry-looking stools  For urgent or emergent issues, a gastroenterologist can be reached at any hour by calling [phone].   DIET:  Clear liquids only today (Wednesday), proceed to soft foods starting tomorrow (Thursday). but  then you may proceed to your regular diet as tolerated on Friday.  Drink plenty of fluids but you should avoid alcoholic beverages for 24 hours.  MEDICATIONS: Continue present medications. Continue with the new antiacid regimen started in the office last week (Omeprazole 40 mg, one pill by mouth before breakfast every morning and Famotidine 20 mg, one pill at bedtime every night).  Please see handouts given to you by your recovery nurse.  ACTIVITY:  You should plan to take it easy for the rest of today and you should NOT DRIVE or use heavy machinery until tomorrow (because of the sedation medicines used during the test).    FOLLOW UP: Our staff will call the number listed on your records 48-72 hours following your procedure to check on you and address any questions or concerns that you may have regarding the information given to you following your procedure. If we do not reach you, we will leave a message.  We will attempt to reach you two times.  During this call, we will ask if you have developed any symptoms of COVID 19. If you develop any symptoms (ie: fever, flu-like symptoms, shortness of breath, cough etc.) before then, please call [phone].  If you test positive for Covid 19 in the 2 weeks post procedure, please call and report this information to [place].    If any biopsies were taken you will be contacted by phone or by letter within the next 1-3 weeks.  Please call us  at [phone] if you have not heard about the biopsies in 3 weeks.   Thank you for allowing [place] to provide for your healthcare needs today.   SIGNATURES/CONFIDENTIALITY: You and/or your care partner have signed paperwork which will be entered into your electronic medical record.  These signatures attest to the fact that that the information above on your After Visit Summary has been reviewed and is understood.  Full responsibility of the confidentiality of this discharge information lies with you and/or your  care-partner.

## 2018-11-22 NOTE — Op Note (Signed)
[place] Patient Name: Fernando Nguyen Procedure Date: 11/22/2018 3:21 PM MRN: [id number] Endoscopist: Milus Banister , MD Age: 58 Referring MD:  Date of Birth: [birth year] Gender: Male Account #: [id number] Procedure:                Upper GI endoscopy Indications:              Dysphagia Medicines:                Monitored Anesthesia Care Procedure:                Pre-Anesthesia Assessment:                           - Prior to the procedure, a History and Physical                            was performed, and patient medications and                            allergies were reviewed. The patient's tolerance of                            previous anesthesia was also reviewed. The risks                            and benefits of the procedure and the sedation                            options and risks were discussed with the patient.                            All questions were answered, and informed consent                            was obtained. Prior Anticoagulants: The patient has                            taken no previous anticoagulant or antiplatelet                            agents. ASA Grade Assessment: II - A patient with                            mild systemic disease. After reviewing the risks                            and benefits, the patient was deemed in                            satisfactory condition to undergo the procedure.                           After obtaining informed consent, the endoscope was  passed under direct vision. Throughout the                            procedure, the patient's blood pressure, pulse, and                            oxygen saturations were monitored continuously. The                            Endoscope was introduced through the mouth, and                            advanced to the second part of duodenum. The upper                            GI endoscopy was accomplished without  difficulty.                            The patient tolerated the procedure well. Scope In: Scope Out: Findings:                 One benign-appearing, intrinsic moderate stenosis                            was found at the gastroesophageal junction. A TTS                            dilator was passed through the scope. Dilation with                            an 18-19-20 mm balloon dilator was performed to 20                            mm. There was a linear, slightly longer than usual                            superficial mucosal tear and the site with mild,                            self limited bleeding.                           A medium-sized hiatal hernia was present.                           The exam was otherwise without abnormality. Complications:            No immediate complications. Estimated blood loss:                            None. Estimated Blood Loss:     Estimated blood loss: none. Impression:               - Benign-appearing esophageal stenosis. Dilated.                           -  Medium-sized hiatal hernia.                           - The examination was otherwise normal. Recommendation:           - Patient has a contact number available for                            emergencies. The signs and symptoms of potential                            delayed complications were discussed with the                            patient. Return to normal activities tomorrow.                            Written discharge instructions were provided to the                            patient.                           - Clear liquids today, soft foods only tomorrow.                           - Continue present medications. Continue the new                            antiacid regimen started in the office last week                            (omeprazole 40mg  pill before breakfast every                            morning and famoitidine 20mg  pill at bedtime every                             night. Milus Banister, MD 11/22/2018 4:03:10 PM This report has been signed electronically.

## 2018-11-22 NOTE — Op Note (Signed)
[facility] Patient Name: Fernando Nguyen Procedure Date: 11/22/2018 3:22 PM MRN: [id number] Endoscopist: Milus Banister , MD Age: 58 Referring MD:  Date of Birth: 1960/06/28 Gender: Male Account #: [id number] Procedure:                Colonoscopy Indications:              High risk colon cancer surveillance: Personal                            history of colonic polyps; Elevated risk for colon                            cancer; both his brother and father had colon                            cancer one while in their 37s. Personal history of                            adenomatous polyps. Colonoscopy 2014 a single                            subcentimeter adenoma was removed. Repeat                            colonoscopy August 2017 showed a completely normal                            colon. At that time I recommended a repeat                            colonoscopy at 3-year interval and also we referred                            him to a genetic counselor to help determine if he                            had Lynch syndrome in his family given his very                            strong family history. He decided not to go ahead                            with that genetic counseling visit Medicines:                Monitored Anesthesia Care Procedure:                Pre-Anesthesia Assessment:                           - Prior to the procedure, a History and Physical                            was performed, and patient medications and  allergies were reviewed. The patient's tolerance of                            previous anesthesia was also reviewed. The risks                            and benefits of the procedure and the sedation                            options and risks were discussed with the patient.                            All questions were answered, and informed consent                            was obtained. Prior Anticoagulants:  The patient has                            taken no previous anticoagulant or antiplatelet                            agents. ASA Grade Assessment: II - A patient with                            mild systemic disease. After reviewing the risks                            and benefits, the patient was deemed in                            satisfactory condition to undergo the procedure.                           After obtaining informed consent, the colonoscope                            was passed under direct vision. Throughout the                            procedure, the patient's blood pressure, pulse, and                            oxygen saturations were monitored continuously. The                            Colonoscope was introduced through the anus and                            advanced to the the cecum, identified by                            appendiceal orifice and ileocecal valve. The  colonoscopy was performed without difficulty. The                            patient tolerated the procedure well. The quality                            of the bowel preparation was good. The ileocecal                            valve, appendiceal orifice, and rectum were                            photographed. Scope In: 3:30:37 PM Scope Out: 3:45:33 PM Scope Withdrawal Time: 0 hours 13 minutes 9 seconds  Total Procedure Duration: 0 hours 14 minutes 56 seconds  Findings:                 Four sessile polyps were found in the transverse                            colon, ascending colon and cecum. The polyps were 1                            to 2 mm in size. These polyps were removed with a                            cold snare. Resection and retrieval were complete.                           Multiple small-mouthed diverticula were found in                            the left colon.                           The exam was otherwise without abnormality on                             direct and retroflexion views. Complications:            No immediate complications. Estimated blood loss:                            None. Estimated Blood Loss:     Estimated blood loss: none. Impression:               - Four 1 to 2 mm polyps in the transverse colon, in                            the ascending colon and in the cecum, removed with                            a cold snare. Resected and retrieved.                           -  Diverticulosis in the left colon.                           - The examination was otherwise normal on direct                            and retroflexion views. Recommendation:           - Patient has a contact number available for                            emergencies. The signs and symptoms of potential                            delayed complications were discussed with the                            patient. Return to normal activities tomorrow.                            Written discharge instructions were provided to the                            patient.                           - Resume previous diet.                           - Continue present medications.                           - Await pathology results. Milus Banister, MD 11/22/2018 3:48:36 PM This report has been signed electronically.

## 2018-11-24 ENCOUNTER — Other Ambulatory Visit: Payer: Self-pay | Admitting: Gastroenterology

## 2018-11-24 ENCOUNTER — Telehealth: Payer: Self-pay

## 2018-11-24 DIAGNOSIS — D122 Benign neoplasm of ascending colon: Secondary | ICD-10-CM | POA: Diagnosis not present

## 2018-11-24 DIAGNOSIS — D123 Benign neoplasm of transverse colon: Secondary | ICD-10-CM

## 2018-11-24 NOTE — Telephone Encounter (Signed)
  Follow up Call-  Call back number 11/22/2018  Post procedure Call Back phone  # [phone]  Permission to leave phone message Yes  Some recent data might be hidden     Patient questions:  Do you have a fever, pain , or abdominal swelling? No. Pain Score  0 *  Have you tolerated food without any problems? Yes.    Have you been able to return to your normal activities? Yes.    Do you have any questions about your discharge instructions: Diet   No. Medications  No. Follow up visit  No.  Do you have questions or concerns about your Care? No.  Actions: * If pain score is 4 or above: No action needed, pain <4.  1. Have you developed a fever since your procedure? No  2.   Have you had an respiratory symptoms (SOB or cough) since your procedure? No  3.   Have you tested positive for COVID 19 since your procedure no  4.   Have you had any family members/close cNo    If yes to any of these questions please route to Joylene John, RN and Alphonsa Gin, Therapist, sports.

## 2018-12-02 ENCOUNTER — Encounter: Payer: Self-pay | Admitting: Gastroenterology

## 2018-12-27 ENCOUNTER — Telehealth: Payer: Self-pay | Admitting: Gastroenterology

## 2018-12-27 NOTE — Telephone Encounter (Signed)
Pharmacy called for directions on a refill speak with Ysidro Evert or Orvil Feil at [phone]

## 2019-01-01 NOTE — Telephone Encounter (Signed)
Spoke to Simpson at [facility]. Pepcid prescription clarified.

## 2019-11-19 ENCOUNTER — Other Ambulatory Visit: Payer: Self-pay | Admitting: Gastroenterology

## 2020-10-14 ENCOUNTER — Encounter: Payer: Self-pay | Admitting: Gastroenterology

## 2022-03-24 ENCOUNTER — Encounter: Payer: Self-pay | Admitting: Gastroenterology

## 2022-03-24 ENCOUNTER — Ambulatory Visit: Payer: Commercial Managed Care - PPO | Admitting: Gastroenterology

## 2022-03-24 VITALS — BP 130/80 | HR 75 | Ht 65.0 in | Wt 193.0 lb

## 2022-03-24 DIAGNOSIS — Z86010 Personal history of colonic polyps: Secondary | ICD-10-CM

## 2022-03-24 DIAGNOSIS — K625 Hemorrhage of anus and rectum: Secondary | ICD-10-CM | POA: Diagnosis not present

## 2022-03-24 DIAGNOSIS — R194 Change in bowel habit: Secondary | ICD-10-CM | POA: Diagnosis not present

## 2022-03-24 MED ORDER — NA SULFATE-K SULFATE-MG SULF 17.5-3.13-1.6 GM/177ML PO SOLN: 1.0000 | Freq: Once | ORAL | 0 refills | Status: AC

## 2022-03-24 NOTE — Progress Notes (Signed)
[name] Consult Note:  History: Fernando Nguyen 03/24/2022  Referring provider: Prudy Feeler, PA-C  Reason for consult/chief complaint: Rectal Bleeding (Patient states that he saw bright red blood after a bowel movement 3 weeks ago. Patient states he is having rectal itching and burning on and off. Patient states he is having itching and burning right now.)   Subjective  HPI:  This is a very pleasant 62 year old man known to Dr. Yates Decamp here to see me today for recent rectal bleeding.  For about the last month he has noticed a change in bowel habits with stools are semiformed or mushy.  A few weeks back he had a BM with bright red blood.  No further bleeding episodes since then, but he has a burning and itching rectal discomfort much of the time.  He has had some intermittent mid to lower abdominal burning discomfort for about the same period of time.  Denies recurrence of dysphagia, denies nausea vomiting early satiety or weight loss.  He says he has been doing a lot of lifting during home renovation and wonders if that may have triggered some hemorrhoid problems.  Procedures with Dr. Ardis Hughs November 2020: Colonoscopy for history of colon cancer in patient's father and brother.  Subcentimeter tubular adenoma in 2014, no polyps found in 2017.  [facility] patient reportedly offered genetic testing but declined) On the 2020 exam, 4 diminutive tubular adenomas removed. EGD done same date for esophageal dysphagia.  EG junction stricture dilated with 20 mm balloon.   [name] clarifies the above family history and says that both his father and brother actually had stomach cancer, and that his brother had a significant portion of his stomach removed but also has been diagnosed with melanoma around that time. ROS:  Review of Systems  Constitutional:  Negative for appetite change and unexpected weight change.  HENT:  Negative for mouth sores and voice change.   Eyes:   Negative for pain and redness.  Respiratory:  Negative for cough and shortness of breath.   Cardiovascular:  Negative for chest pain and palpitations.  Genitourinary:  Negative for dysuria and hematuria.  Musculoskeletal:  Negative for arthralgias and myalgias.  Skin:  Negative for pallor and rash.  Neurological:  Negative for weakness and headaches.  Hematological:  Negative for adenopathy.     Past Medical History: Past Medical History:  Diagnosis Date   Colon polyps      Past Surgical History: Past Surgical History:  Procedure Laterality Date   CHOLECYSTECTOMY       Family History: Family History  Problem Relation Age of Onset   Colon cancer Father        deceased   Colon polyps Father    Ulcers Father        gastric   Colon cancer Brother 12       deceased age 92   Diabetes Mother    Kidney disease Mother    Heart disease Mother    Diabetes Paternal Grandmother    Heart disease Paternal Grandmother    Ovarian cancer Maternal Grandmother     Social History: Social History   Socioeconomic History   Marital status: Married    Spouse name: Not on file   Number of children: 2   Years of education: Not on file   Highest education level: Not on file  Occupational History   Occupation: Manufacturing systems engineer: [employer]  Tobacco Use   Smoking status: Never  Smokeless tobacco: Never  Vaping Use   Vaping Use: Never used  Substance and Sexual Activity   Alcohol use: No   Drug use: No   Sexual activity: Not on file  Other Topics Concern   Not on file  Social History Narrative   Not on file   Social Determinants of Health   Financial Resource Strain: Not on file  Food Insecurity: Not on file  Transportation Needs: Not on file  Physical Activity: Not on file  Stress: Not on file  Social Connections: Not on file    Allergies: Allergies  Allergen Reactions   Penicillins     Outpatient Meds: Current Outpatient Medications  Medication  Sig Dispense Refill   famotidine (PEPCID) 20 MG tablet Take 1 tablet (20 mg total) by mouth at bedtime. One to two times a day 30 tablet 11   omeprazole (PRILOSEC) 40 MG capsule TAKE ONE CAPSULE BY MOUTH DAILY 30 capsule 11   Current Facility-Administered Medications  Medication Dose Route Frequency Provider Last Rate Last Admin   0.9 %  sodium chloride infusion  500 mL Intravenous Continuous Milus Banister, MD       0.9 %  sodium chloride infusion  500 mL Intravenous Once Milus Banister, MD          ___________________________________________________________________ Objective   Exam:  BP 130/80   Pulse 75   Ht 5\' 5"  (1.651 m)   Wt 193 lb (87.5 kg)   BMI 32.12 kg/m  Wt Readings from Last 3 Encounters:  03/24/22 193 lb (87.5 kg)  11/22/18 195 lb (88.5 kg)  11/15/18 195 lb (88.5 kg)    General: Well-appearing Eyes: sclera anicteric, no redness ENT: oral mucosa moist without lesions, no cervical or supraclavicular lymphadenopathy CV: Regular without appreciable murmur, no JVD, no peripheral edema Resp: clear to auscultation bilaterally, normal RR and effort noted GI: soft, no tenderness, with active bowel sounds. No guarding or palpable organomegaly noted. Skin; warm and dry, no rash or jaundice noted Neuro: awake, alert and oriented x 3. Normal gross motor function and fluent speech Perianal exam normal.  DRE: Normal sphincter tone, no fissure or tenderness or palpable internal lesion. Labs: No recent data for review  Assessment: Encounter Diagnoses  Name Primary?   Rectal bleeding Yes   History of colonic polyps    Change in bowel habits     Recent episode of bleeding that sounds likely hemorrhoidal, particularly with the other symptoms that he still has.  Recent change in bowel habits around the same time of unclear cause.  No changes in diet medicines or other definite triggers.  History of colon polyps.  There was originally a family history of colon cancer,  that now seems in doubt based on what he tells me today.  Plan:  Surveillance colonoscopy.  At that time, we will clearly also be able to evaluate any hemorrhoids that are present.  The benefits and risks of the planned procedure were described in detail with the patient or (when appropriate) their health care proxy.  Risks were outlined as including, but not limited to, bleeding, infection, perforation, adverse medication reaction leading to cardiac or pulmonary decompensation, pancreatitis (if ERCP).  The limitation of incomplete mucosal visualization was also discussed.  No guarantees or warranties were given.  OTC Preparation H suppository twice daily for the next week, then as needed thereafter.   Thank you for the courtesy of this consult.  Please call me with any questions or concerns.  Nelida Meuse III  CC: Referring provider noted above

## 2022-03-24 NOTE — Patient Instructions (Addendum)
_______________________________________________________  If your blood pressure at your visit was 140/90 or greater, please contact your primary care physician to follow up on this.  _______________________________________________________  If you are age 63 or older, your body mass index should be between 23-30. Your Body mass index is 32.12 kg/m. If this is out of the aforementioned range listed, please consider follow up with your Primary Care Provider.  If you are age 40 or younger, your body mass index should be between 19-25. Your Body mass index is 32.12 kg/m. If this is out of the aformentioned range listed, please consider follow up with your Primary Care Provider.   ________________________________________________________  The [facility] providers would like to encourage you to use [facility] to communicate with providers for non-urgent requests or questions.  Due to long hold times on the telephone, sending your provider a message by [facility] may be a faster and more efficient way to get a response.  Please allow 48 business hours for a response.  Please remember that this is for non-urgent requests.  _______________________________________________________  Fernando Nguyen have been scheduled for a colonoscopy. Please follow written instructions given to you at your visit today.  Please pick up your prep supplies at the pharmacy within the next 1-3 days. If you use inhalers (even only as needed), please bring them with you on the day of your procedure.  Due to recent changes in healthcare laws, you may see the results of your imaging and laboratory studies on MyChart before your provider has had a chance to review them.  We understand that in some cases there may be results that are confusing or concerning to you. Not all laboratory results come back in the same time frame and the provider may be waiting for multiple results in order to interpret others.  Please give [place] 48 hours in order for your  provider to thoroughly review all the results before contacting the office for clarification of your results.   Please purchase over the counter Preparation H suppositories, use one per rectum twice a day for 7 days   It was a pleasure to see you today!  Thank you for trusting me with your gastrointestinal care!

## 2022-04-29 ENCOUNTER — Encounter: Payer: Self-pay | Admitting: Gastroenterology

## 2022-04-29 ENCOUNTER — Ambulatory Visit (AMBULATORY_SURGERY_CENTER): Payer: Commercial Managed Care - PPO | Admitting: Gastroenterology

## 2022-04-29 VITALS — BP 125/77 | HR 66 | Temp 97.3°F | Resp 17 | Ht 65.0 in | Wt 193.0 lb

## 2022-04-29 DIAGNOSIS — Z09 Encounter for follow-up examination after completed treatment for conditions other than malignant neoplasm: Secondary | ICD-10-CM

## 2022-04-29 DIAGNOSIS — Z86010 Personal history of colonic polyps: Secondary | ICD-10-CM | POA: Diagnosis not present

## 2022-04-29 DIAGNOSIS — K6389 Other specified diseases of intestine: Secondary | ICD-10-CM | POA: Diagnosis not present

## 2022-04-29 DIAGNOSIS — D122 Benign neoplasm of ascending colon: Secondary | ICD-10-CM

## 2022-04-29 MED ORDER — SODIUM CHLORIDE 0.9 % IV SOLN: 500.0000 mL | INTRAVENOUS | Status: DC

## 2022-04-29 NOTE — Progress Notes (Signed)
To pacu, VSS. Report to [email or web address] 

## 2022-04-29 NOTE — Progress Notes (Signed)
Called to room to assist during endoscopic procedure.  Patient ID and intended procedure confirmed with present staff. Received instructions for my participation in the procedure from the performing physician.  

## 2022-04-29 NOTE — Progress Notes (Signed)
Patient reports no changes to health or medications since office visit.  

## 2022-04-29 NOTE — Progress Notes (Signed)
History and Physical:  This patient presents for endoscopic testing for: Encounter Diagnosis  Name Primary?   Personal history of colonic polyps Yes    Clinical details in 03/24/22 office consult note Diminutive TA x 08 Nov 2018, no polyps 2017, singe small polyp 2014 Patient now clarifies that he does not have a family history of colon cancer  Patient is otherwise without complaints or active issues today.   Past Medical History: Past Medical History:  Diagnosis Date   Colon polyps      Past Surgical History: Past Surgical History:  Procedure Laterality Date   CHOLECYSTECTOMY     COLONOSCOPY      Allergies: Allergies  Allergen Reactions   Penicillins     Outpatient Meds: Current Outpatient Medications  Medication Sig Dispense Refill   fluticasone (FLONASE) 50 MCG/ACT nasal spray Place 2 sprays into both nostrils daily.     omeprazole (PRILOSEC) 40 MG capsule TAKE ONE CAPSULE BY MOUTH DAILY 30 capsule 11   Current Facility-Administered Medications  Medication Dose Route Frequency Provider Last Rate Last Admin   0.9 %  sodium chloride infusion  500 mL Intravenous Continuous Charlie Pitter III, MD          ___________________________________________________________________ Objective   Exam:  BP 139/84   Pulse 73   Temp (!) 97.3 F (36.3 C)   Resp 16   Ht  (1.651 m)   Wt 193 lb (87.5 kg)   SpO2 97%   BMI 32.12 kg/m   CV: regular , S1/S2 Resp: clear to auscultation bilaterally, normal RR and effort noted GI: soft, no tenderness, with active bowel sounds.   Assessment: Encounter Diagnosis  Name Primary?   Personal history of colonic polyps Yes     Plan: Colonoscopy  The benefits and risks of the planned procedure were described in detail with the patient or (when appropriate) their health care proxy.  Risks were outlined as including, but not limited to, bleeding, infection, perforation, adverse medication reaction leading to cardiac or  pulmonary decompensation, pancreatitis (if ERCP).  The limitation of incomplete mucosal visualization was also discussed.  No guarantees or warranties were given.    The patient is appropriate for an endoscopic procedure in the ambulatory setting.   - Fernando Jupiter, MD

## 2022-04-29 NOTE — Patient Instructions (Signed)
Resume all of your previous medicines today as ordered.  YOU HAD AN ENDOSCOPIC PROCEDURE TODAY AT THE [facility]:   Refer to the procedure report that was given to you for any specific questions about what was found during the examination.  If the procedure report does not answer your questions, please call your gastroenterologist to clarify.  If you requested that your care partner not be given the details of your procedure findings, then the procedure report has been included in a sealed envelope for you to review at your convenience later.  YOU SHOULD EXPECT: Some feelings of bloating in the abdomen. Passage of more gas than usual.  Walking can help get rid of the air that was put into your GI tract during the procedure and reduce the bloating. If you had a lower endoscopy (such as a colonoscopy or flexible sigmoidoscopy) you may notice spotting of blood in your stool or on the toilet paper. If you underwent a bowel prep for your procedure, you may not have a normal bowel movement for a few days.  Please Note:  You might notice some irritation and congestion in your nose or some drainage.  This is from the oxygen used during your procedure.  There is no need for concern and it should clear up in a day or so.  SYMPTOMS TO REPORT IMMEDIATELY:  Following lower endoscopy (colonoscopy or flexible sigmoidoscopy):  Excessive amounts of blood in the stool  Significant tenderness or worsening of abdominal pains  Swelling of the abdomen that is new, acute  Fever of 100F or higher  For urgent or emergent issues, a gastroenterologist can be reached at any hour by calling [phone]. Do not use MyChart messaging for urgent concerns.    DIET:  We do recommend a small meal at first, but then you may proceed to your regular diet.  Drink plenty of fluids but you should avoid alcoholic beverages for 24 hours. Try to eat a high fiber diet, and drink plenty of water.  ACTIVITY:  You should  plan to take it easy for the rest of today and you should NOT DRIVE or use heavy machinery until tomorrow (because of the sedation medicines used during the test).    FOLLOW UP: Our staff will call the number listed on your records the next business day following your procedure.  We will call around 7:15- 8:00 am to check on you and address any questions or concerns that you may have regarding the information given to you following your procedure. If we do not reach you, we will leave a message.     If any biopsies were taken you will be contacted by phone or by letter within the next 1-3 weeks.  Please call us at [phone] if you have not heard about the biopsies in 3 weeks.    SIGNATURES/CONFIDENTIALITY: You and/or your care partner have signed paperwork which will be entered into your electronic medical record.  These signatures attest to the fact that that the information above on your After Visit Summary has been reviewed and is understood.  Full responsibility of the confidentiality of this discharge information lies with you and/or your care-partner.

## 2022-04-29 NOTE — Op Note (Signed)
[facility] Patient Name: Fernando Nguyen Procedure Date: 04/29/2022 7:57 AM MRN: [id number] Endoscopist: Sherilyn Cooter L. Myrtie Neither , MD, [id number] Age: 62 Referring MD:  Date of Birth: Dec 21, 1960 Gender: Male Account #: [id number] Procedure:                Colonoscopy Indications:              Surveillance: Personal history of adenomatous                            polyps on last colonoscopy > 3 years ago                           4 diminutive tubular adenomas November 2020                           No polyps 2017                           Subcentimeter tubular adenoma 2014 Medicines:                Monitored Anesthesia Care Procedure:                Pre-Anesthesia Assessment:                           - Prior to the procedure, a History and Physical                            was performed, and patient medications and                            allergies were reviewed. The patient's tolerance of                            previous anesthesia was also reviewed. The risks                            and benefits of the procedure and the sedation                            options and risks were discussed with the patient.                            All questions were answered, and informed consent                            was obtained. Prior Anticoagulants: The patient has                            taken no anticoagulant or antiplatelet agents. ASA                            Grade Assessment: II - A patient with mild systemic  disease. After reviewing the risks and benefits,                            the patient was deemed in satisfactory condition to                            undergo the procedure.                           After obtaining informed consent, the colonoscope                            was passed under direct vision. Throughout the                            procedure, the patient's blood pressure, pulse, and                             oxygen saturations were monitored continuously. The                            CF [id number] #[id number] was introduced through the anus                            and advanced to the the cecum, identified by                            appendiceal orifice and ileocecal valve. The                            colonoscopy was performed without difficulty. The                            patient tolerated the procedure well. The quality                            of the bowel preparation was good with lavage. The                            ileocecal valve, appendiceal orifice, and rectum                            were photographed. Scope In: 8:06:30 AM Scope Out: 8:15:23 AM Scope Withdrawal Time: 0 hours 7 minutes 22 seconds  Total Procedure Duration: 0 hours 8 minutes 53 seconds  Findings:                 The perianal and digital rectal examinations were                            normal.                           Repeat examination of right colon under NBI  performed.                           A 6 mm polyp was found in the mid ascending colon.                            The polyp was semi-sessile. The polyp was removed                            with a cold snare. Resection and retrieval were                            complete.                           Diverticula were found in the left colon.                           Internal hemorrhoids were found.                           The exam was otherwise without abnormality on                            direct and retroflexion views. Complications:            No immediate complications. Estimated Blood Loss:     Estimated blood loss was minimal. Impression:               - One 6 mm polyp in the mid ascending colon,                            removed with a cold snare. Resected and retrieved.                           - Diverticulosis in the left colon.                           - Internal hemorrhoids. (explains recent  episode of                            rectal bleeding)                           - The examination was otherwise normal on direct                            and retroflexion views. Recommendation:           - Patient has a contact number available for                            emergencies. The signs and symptoms of potential                            delayed complications were discussed with the  patient. Return to normal activities tomorrow.                            Written discharge instructions were provided to the                            patient.                           - Resume previous diet.                           - Continue present medications.                           - Await pathology results.                           - Repeat colonoscopy is recommended for                            surveillance. The colonoscopy date will be                            determined after pathology results from today's                            exam become available for review. (more water with                            prep for next exam - diverticulosis)                           - High fiber diet, water and exercise to help                            maintain regularity and decrease hemorrhoid                            symptoms. Return to my office as needed for                            symptoms. Alyssabeth Bruster L. Myrtie Neither, MD 04/29/2022 8:21:38 AM This report has been signed electronically.

## 2022-04-30 ENCOUNTER — Telehealth: Payer: Self-pay | Admitting: *Deleted

## 2022-04-30 NOTE — Telephone Encounter (Signed)
Follow up call attempt.  Phone busy multiple attempts.

## 2022-05-04 ENCOUNTER — Encounter: Payer: Self-pay | Admitting: Gastroenterology

## 2023-02-28 ENCOUNTER — Encounter (HOSPITAL_BASED_OUTPATIENT_CLINIC_OR_DEPARTMENT_OTHER): Payer: Self-pay | Admitting: Cardiology

## 2023-02-28 ENCOUNTER — Ambulatory Visit (HOSPITAL_BASED_OUTPATIENT_CLINIC_OR_DEPARTMENT_OTHER): Payer: Commercial Managed Care - PPO | Admitting: Cardiology

## 2023-02-28 VITALS — BP 118/82 | HR 105 | Ht 65.0 in | Wt 200.6 lb

## 2023-02-28 DIAGNOSIS — R0609 Other forms of dyspnea: Secondary | ICD-10-CM

## 2023-02-28 DIAGNOSIS — R9431 Abnormal electrocardiogram [ECG] [EKG]: Secondary | ICD-10-CM | POA: Diagnosis not present

## 2023-02-28 DIAGNOSIS — Z01812 Encounter for preprocedural laboratory examination: Secondary | ICD-10-CM

## 2023-02-28 DIAGNOSIS — Z7189 Other specified counseling: Secondary | ICD-10-CM

## 2023-02-28 DIAGNOSIS — R072 Precordial pain: Secondary | ICD-10-CM | POA: Diagnosis not present

## 2023-02-28 DIAGNOSIS — Z8249 Family history of ischemic heart disease and other diseases of the circulatory system: Secondary | ICD-10-CM

## 2023-02-28 MED ORDER — METOPROLOL TARTRATE 100 MG PO TABS
100.0000 mg | ORAL_TABLET | Freq: Once | ORAL | 0 refills | Status: AC
Start: 2023-02-28 — End: 2023-02-28

## 2023-02-28 NOTE — Patient Instructions (Addendum)
 Medication Instructions:  Your physician has recommended you make the following change in your medication:   Take Metoprolol Tartrate 100 mg 2 hours before cardiac CT  Lab Work: Your physician recommends that you return for lab work in: BMP, LIPIDS, and LPA  Testing/Procedures: Your physician has requested that you have an echocardiogram. Echocardiography is a painless test that uses sound waves to create images of your heart. It provides your doctor with information about the size and shape of your heart and how well your heart's chambers and valves are working. This procedure takes approximately one hour. There are no restrictions for this procedure. Please do NOT wear cologne, perfume, aftershave, or lotions (deodorant is allowed). Please arrive 15 minutes prior to your appointment time.  Please note: We ask at that you not bring children with you during ultrasound (echo/ vascular) testing. Due to room size and safety concerns, children are not allowed in the ultrasound rooms during exams. Our front office staff cannot provide observation of children in our lobby area while testing is being conducted. An adult accompanying a patient to their appointment will only be allowed in the ultrasound room at the discretion of the ultrasound technician under special circumstances. We apologize for any inconvenience.   Your physician has requested that you have a cardiac CT.   Follow-Up: At [facility], you and your health needs are our priority.  As part of our continuing mission to provide you with exceptional heart care, we have created designated Provider Care Teams.  These Care Teams include your primary Cardiologist (physician) and Advanced Practice Providers (APPs -  Physician Assistants and Nurse Practitioners) who all work together to provide you with the care you need, when you need it.  We recommend signing up for the patient portal called "MyChart".  Sign up information is provided  on this After Visit Summary.  MyChart is used to connect with patients for Virtual Visits (Telemedicine).  Patients are able to view lab/test results, encounter notes, upcoming appointments, etc.  Non-urgent messages can be sent to your provider as well.   To learn more about what you can do with MyChart, go to [email or web address].    Your next appointment:   April 13, 2023 @ 9:20 AM  Provider:   Jodelle Red, MD

## 2023-02-28 NOTE — Progress Notes (Signed)
 Cardiology Office Note:  .   Date:  02/28/2023  ID:  Fernando Nguyen, DOB 08/03/1960, MRN [id number] PCP: Elwin Mocha  [facility] Providers Cardiologist:  Jodelle Red, MD {  History of Present Illness: .   Fernando Nguyen is a 63 y.o. male without prior PMH who is seen as a new consult for dyspnea on exertion at the request of Dr. Leavy Cella.   Today:  Referral notes reviewed under media tab. Note from 11/30/22 by Dr. Neita Carp noted 2-3 months of progressive dyspnea on exertion, no associated chest pain/pressure. Referred to cardiology for further evaluation.  Wife reports shooting pain in let shoulder/chest. He notes this is brief and intermittent, not the same time he is having shortness of breath.   Hasn't had regular medical care.   Cardiovascular risk factors: Prior clinical ASCVD: none Comorbid conditions: Denies hypertension, hyperlipidemia (borderline), diabetes, chronic kidney disease  Metabolic syndrome/Obesity: current weight, 200 lb Chronic inflammatory conditions: none Tobacco use history: never  Family history: mother died of MI, age 35--had HTN and DM. Father died of cancer age 62, brother died of cancer in his 47s Prior pertinent testing and/or incidental findings: none  First noted shortness of breath about 5 months ago; started with walking a flight of stairs but now notices after walking about 100 yards.  Improves after resting 30-60 seconds.   Wife endorses snoring.  Caring for grandchildren after their son passed away.  Had fast food breakfast prior to visit today.  ROS: Denies shortness of breath at rest. No PND, orthopnea, LE edema or unexpected weight gain. No syncope or palpitations. ROS otherwise negative except as noted.   Studies Reviewed: Marland Kitchen    EKG:  EKG Interpretation Date/Time:  Monday February 28 2023 09:32:01 EST Ventricular Rate:  83 PR Interval:  144 QRS Duration:  92 QT Interval:  346 QTC Calculation: 406 R  Axis:   68  Text Interpretation: Normal sinus rhythm Lateral infarct , age undetermined Cannot rule out Inferior infarct , age undetermined ST & T wave abnormality, consider anterior ischemia Confirmed by Jodelle Red [phone]) on 02/28/2023 10:00:26 AM    Physical Exam:   VS:  BP 118/82   Pulse (!) 105   Ht 5\' 5"  (1.651 m)   Wt 200 lb 9.6 oz (91 kg)   SpO2 98%   BMI 33.38 kg/m    Wt Readings from Last 3 Encounters:  02/28/23 200 lb 9.6 oz (91 kg)  04/29/22 193 lb (87.5 kg)  03/24/22 193 lb (87.5 kg)    GEN: Well nourished, well developed in no acute distress HEENT: Normal, moist mucous membranes NECK: No JVD CARDIAC: regular rhythm, normal S1 and S2, no rubs or gallops. No murmur. VASCULAR: Radial and DP pulses 2+ bilaterally. No carotid bruits RESPIRATORY:  Clear to auscultation without rales, wheezing or rhonchi  ABDOMEN: Soft, non-tender, non-distended MUSCULOSKELETAL:  Ambulates independently SKIN: Warm and dry, no edema NEUROLOGIC:  Alert and oriented x 3. No focal neuro deficits noted. PSYCHIATRIC:  Normal affect    ASSESSMENT AND PLAN: .    Dyspnea on exertion Chest pain Abnormal ECG Family history of heart disease -concerning for ischemia -only stable symptoms thus far, reviewed unstable symptoms to watch for -discussed further testing. After shared decision making, will proceed with coronary CT -check BMET prior to CT given need for contrast -ordered metoprolol tartate 100 mg to be given prior to test -he has not taken PDEi, counseled on not taking this before test due  to risk with nitroglycerin -will check echo to rule out structural abnormalities -reviewed red flag warning signs that need immediate medical attention -check lipids, lp(a)   CV risk counseling and prevention -recommend heart healthy/Mediterranean diet, with whole grains, fruits, vegetable, fish, lean meats, nuts, and olive oil. Limit salt. -recommend moderate walking, 3-5 times/week for  30-50 minutes each session. Aim for at least 150 minutes.week. Goal should be pace of 3 miles/hours, or walking 1.5 miles in 30 minutes -recommend avoidance of tobacco products. Avoid excess alcohol.  Dispo: 6 weeks to review results  Signed, Jodelle Red, MD   Jodelle Red, MD, PhD, [facility] [place]  [facility]  [place]  Heart & Vascular at [facility] at [facility] [address] [place], [place] [address] [phone]

## 2023-03-18 ENCOUNTER — Encounter (HOSPITAL_COMMUNITY): Payer: Self-pay

## 2023-03-23 ENCOUNTER — Ambulatory Visit (HOSPITAL_COMMUNITY)
Admission: RE | Admit: 2023-03-23 | Discharge: 2023-03-23 | Disposition: A | Discharge status: Home / Self Care | Source: Ambulatory Visit | Attending: Cardiology | Admitting: Cardiology

## 2023-03-23 ENCOUNTER — Ambulatory Visit (INDEPENDENT_AMBULATORY_CARE_PROVIDER_SITE_OTHER): Payer: Commercial Managed Care - PPO

## 2023-03-23 DIAGNOSIS — R0609 Other forms of dyspnea: Secondary | ICD-10-CM | POA: Diagnosis not present

## 2023-03-23 DIAGNOSIS — R9431 Abnormal electrocardiogram [ECG] [EKG]: Secondary | ICD-10-CM | POA: Insufficient documentation

## 2023-03-23 LAB — ECHOCARDIOGRAM COMPLETE
Area-P 1/2: 3.42 cm2
S' Lateral: 2.17 cm

## 2023-03-23 MED ORDER — NITROGLYCERIN 0.4 MG SL SUBL
SUBLINGUAL_TABLET | SUBLINGUAL | Status: AC
  Filled 2023-03-23: qty 2

## 2023-03-23 MED ORDER — IOHEXOL 350 MG/ML SOLN
95.0000 mL | Freq: Once | INTRAVENOUS | Status: AC | PRN
  Administered 2023-03-23: 95 mL via INTRAVENOUS

## 2023-03-23 MED ORDER — NITROGLYCERIN 0.4 MG SL SUBL
0.8000 mg | SUBLINGUAL_TABLET | Freq: Once | SUBLINGUAL | Status: AC
  Administered 2023-03-23: 0.8 mg via SUBLINGUAL

## 2023-03-23 NOTE — Progress Notes (Signed)
 Patient tolerated CT well. Vital signs stable encourage to drink water throughout day.Reasons explained and verbalized understanding. Ambulated steady gait.

## 2023-03-24 LAB — BASIC METABOLIC PANEL
BUN/Creatinine Ratio: 9 — ABNORMAL LOW (ref 10–24)
BUN: 10 mg/dL (ref 8–27)
CO2: 22 mmol/L (ref 20–29)
Calcium: 9.4 mg/dL (ref 8.6–10.2)
Chloride: 102 mmol/L (ref 96–106)
Creatinine, Ser: 1.1 mg/dL (ref 0.76–1.27)
Glucose: 98 mg/dL (ref 70–99)
Potassium: 4.8 mmol/L (ref 3.5–5.2)
Sodium: 139 mmol/L (ref 134–144)
eGFR: 75 mL/min/{1.73_m2} (ref >59)

## 2023-03-24 LAB — LIPID PANEL
Chol/HDL Ratio: 4.5 ratio (ref 0.0–5.0)
Cholesterol, Total: 167 mg/dL (ref 100–199)
HDL: 37 mg/dL — ABNORMAL LOW (ref >39)
LDL Chol Calc (NIH): 103 mg/dL — ABNORMAL HIGH (ref 0–99)
Triglycerides: 154 mg/dL — ABNORMAL HIGH (ref 0–149)
VLDL Cholesterol Cal: 27 mg/dL (ref 5–40)

## 2023-03-24 LAB — LIPOPROTEIN A (LPA): Lipoprotein (a): 36.8 nmol/L (ref <75.0)

## 2023-04-08 ENCOUNTER — Encounter (HOSPITAL_BASED_OUTPATIENT_CLINIC_OR_DEPARTMENT_OTHER): Payer: Self-pay

## 2023-04-13 ENCOUNTER — Ambulatory Visit (HOSPITAL_BASED_OUTPATIENT_CLINIC_OR_DEPARTMENT_OTHER): Payer: Commercial Managed Care - PPO | Admitting: Cardiology

## 2023-04-13 ENCOUNTER — Encounter (HOSPITAL_BASED_OUTPATIENT_CLINIC_OR_DEPARTMENT_OTHER): Payer: Self-pay | Admitting: Cardiology

## 2023-04-13 VITALS — BP 134/70 | HR 73 | Ht 65.0 in | Wt 200.6 lb

## 2023-04-13 DIAGNOSIS — R0609 Other forms of dyspnea: Secondary | ICD-10-CM | POA: Diagnosis not present

## 2023-04-13 DIAGNOSIS — I2510 Atherosclerotic heart disease of native coronary artery without angina pectoris: Secondary | ICD-10-CM | POA: Diagnosis not present

## 2023-04-13 DIAGNOSIS — E782 Mixed hyperlipidemia: Secondary | ICD-10-CM | POA: Diagnosis not present

## 2023-04-13 DIAGNOSIS — Z8249 Family history of ischemic heart disease and other diseases of the circulatory system: Secondary | ICD-10-CM

## 2023-04-13 DIAGNOSIS — Z7189 Other specified counseling: Secondary | ICD-10-CM

## 2023-04-13 MED ORDER — ASPIRIN 81 MG PO TBEC: 81.0000 mg | DELAYED_RELEASE_TABLET | Freq: Every day | ORAL | Status: AC

## 2023-04-13 MED ORDER — ROSUVASTATIN CALCIUM 20 MG PO TABS: 20.0000 mg | ORAL_TABLET | Freq: Every day | ORAL | 3 refills | Status: AC

## 2023-04-13 NOTE — Patient Instructions (Signed)
 Medication Instructions:  Your physician has recommended you make the following change in your medication:   START Asprin START Rosuvastatin  *If you need a refill on your cardiac medications before your next appointment, please call your pharmacy*  Lab Work: Your physician recommends that you return for lab work in 3 months: LIPIDS and LFT  If you have labs (blood work) drawn today and your tests are completely normal, you will receive your results only by: MyChart Message (if you have MyChart) OR A paper copy in the mail If you have any lab test that is abnormal or we need to change your treatment, we will call you to review the results.  Follow-Up: At [facility], you and your health needs are our priority.  As part of our continuing mission to provide you with exceptional heart care, our providers are all part of one team.  This team includes your primary Cardiologist (physician) and Advanced Practice Providers or APPs (Physician Assistants and Nurse Practitioners) who all work together to provide you with the care you need, when you need it.  Your next appointment:   12 month(s)  Provider:   Jodelle Red, MD    We recommend signing up for the patient portal called "MyChart".  Sign up information is provided on this After Visit Summary.  MyChart is used to connect with patients for Virtual Visits (Telemedicine).  Patients are able to view lab/test results, encounter notes, upcoming appointments, etc.  Non-urgent messages can be sent to your provider as well.   To learn more about what you can do with MyChart, go to [email or web address].

## 2023-04-13 NOTE — Progress Notes (Signed)
 Cardiology Office Note:  .   Date:  04/13/2023  ID:  Fernando Nguyen, DOB [birth year], MRN [id number] PCP: Elwin Mocha  [facility] Providers Cardiologist:  Jodelle Red, MD {  History of Present Illness: .   Fernando Nguyen is a 63 y.o. male without prior PMH who is seen for follow up today. I met him 02/28/23 as a new consult for dyspnea on exertion at the request of Dr. Leavy Cella.   CV history: About 5 months of progressive dyspnea on exertion, wife reports shooting pain in let shoulder/chest. He notes this is brief and intermittent, not the same time he is having shortness of breath.   Family history: mother died of MI, age 53--had HTN and DM. Father died of cancer age 32, brother died of cancer in his 46s  Today: Here to review results of testing. Echo 03/2023 showed EF 55-60%, indeterminate diastolic function, normal RV, no significant valve disease. CT coronary 03/2023 showed mild bronchial wall thickening/air trapping consistent with small airway disease. Small hiatal hernia. Calcium score of 530, TPV 510 (severe), mild nonobstructive CAD with focal 30-49% stenosis in LAD. Lpa normal.  We discussed my interpretation: early CAD but not etiology of symptoms. Recommend statin and aspirin based on this. Echo reassuring. His non-cardiac findings on CT suggest that there may be a pulmonary component to his symptoms. Also reviewed small hiatal hernia finding.  Shortness of breath is stable. Can climb stairs but is shortwinded at the top. Also notices LE edema, discussed that echo does not show cardiac etiology of this.  ROS: Denies shortness of breath at rest. No PND, orthopnea, or unexpected weight gain. No syncope or palpitations. ROS otherwise negative except as noted.   Studies Reviewed: Marland Kitchen    EKG:       Physical Exam:   VS:  BP 134/70 (BP Location: Left Arm, Patient Position: Sitting)   Pulse 73   Ht 5\' 5"  (1.651 m)   Wt 200 lb 9.6 oz (91 kg)   SpO2 96%   BMI  33.38 kg/m    Wt Readings from Last 3 Encounters:  04/13/23 200 lb 9.6 oz (91 kg)  02/28/23 200 lb 9.6 oz (91 kg)  04/29/22 193 lb (87.5 kg)    GEN: Well nourished, well developed in no acute distress HEENT: Normal, moist mucous membranes NECK: No JVD CARDIAC: regular rhythm, normal S1 and S2, no rubs or gallops. No murmur. VASCULAR: Radial and DP pulses 2+ bilaterally. No carotid bruits RESPIRATORY:  Clear to auscultation without rales, wheezing or rhonchi  ABDOMEN: Soft, non-tender, non-distended MUSCULOSKELETAL:  Ambulates independently SKIN: Warm and dry, no edema NEUROLOGIC:  Alert and oriented x 3. No focal neuro deficits noted. PSYCHIATRIC:  Normal affect    ASSESSMENT AND PLAN: .    Dyspnea on exertion Family history of heart disease Mixed hyperlipidemia CAD with extensive plaque but no major obstruction -reviewed test results extensively today -lpa normal -echo unremarkable -CT cardiac with high burden of calcified and noncalcified plaque, but only one focal stenosis of 30-49% in LAD, not consistent with etiology of his symptoms -discussed recommendations for statin and aspirin. Extensive conversation. He is amenable to trying, prescribed aspirin 81 mg daily and rosuvastatin 20 mg daily. Recheck lipids/LFTs in 3 mos -discussed non-cardiac findings, especially pulmonary -reviewed red flag warning signs that need immediate medical attention -reviewed lipids. LDL goal less than 70, baseline 103. TG 154, HDL 37. Reviewed lifestyle as below plus recommendations for medical management as above  CV risk counseling and prevention -recommend heart healthy/Mediterranean diet, with whole grains, fruits, vegetable, fish, lean meats, nuts, and olive oil. Limit salt. -recommend moderate walking, 3-5 times/week for 30-50 minutes each session. Aim for at least 150 minutes.week. Goal should be pace of 3 miles/hours, or walking 1.5 miles in 30 minutes -recommend avoidance of tobacco  products. Avoid excess alcohol.  Dispo: 1 year or sooner as needed  Total time of encounter: I spent 44 minutes dedicated to the care of this patient on the date of this encounter to include pre-visit review of records, face-to-face time with the patient discussing conditions above, and clinical documentation with the electronic health record. We specifically spent time today discussing his test results, including showing him his actual images through heartflow, reviewed data/guidelines on aspirin and statin, extensive discussion about statin risk/benefit   Signed, Jodelle Red, MD   Jodelle Red, MD, PhD, [facility]  [facility]  [employer]  Heart & Vascular at [facility] at [facility] [address], [address] [place], [place] [address] [phone]
